# Patient Record
Sex: Male | Born: 1990 | Race: Black or African American | Hispanic: No | Marital: Single | Smoking: Never smoker
Health system: Southern US, Community
[De-identification: ages and names within clinical notes are randomized; demographics above are authoritative.]

## PROBLEM LIST (undated history)

## (undated) DIAGNOSIS — E669 Obesity, unspecified: Secondary | ICD-10-CM

## (undated) HISTORY — DX: Obesity, unspecified: E66.9

## (undated) HISTORY — PX: WISDOM TOOTH EXTRACTION: SHX21

---
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
## 2013-01-28 ENCOUNTER — Ambulatory Visit (INDEPENDENT_AMBULATORY_CARE_PROVIDER_SITE_OTHER): Payer: Medicaid Other | Admitting: Pediatrics

## 2013-01-28 ENCOUNTER — Encounter: Payer: Self-pay | Admitting: Pediatrics

## 2013-01-28 VITALS — BP 128/88 | Ht 68.78 in | Wt 296.7 lb

## 2013-01-28 DIAGNOSIS — Z68.41 Body mass index (BMI) pediatric, greater than or equal to 95th percentile for age: Secondary | ICD-10-CM

## 2013-01-28 DIAGNOSIS — Z0000 Encounter for general adult medical examination without abnormal findings: Secondary | ICD-10-CM

## 2013-01-28 DIAGNOSIS — Z113 Encounter for screening for infections with a predominantly sexual mode of transmission: Secondary | ICD-10-CM

## 2013-01-28 NOTE — Addendum Note (Signed)
Addended by: Fortino Sic C on: 01/28/2013 05:16 PM   Modules accepted: Level of Service

## 2013-01-28 NOTE — Progress Notes (Addendum)
Routine Well-Adolescent Visit    History was provided by the patient.  Randy Dawson is a 22 y.o. male who is here for complete physical exam. Generally he states that he has been in good health. He recently finished a 9 month program at [employer]. He has no current medical problems or other concerns.   Current concerns: None.    Past Medical History:  No Known Allergies Past Medical History  Diagnosis Date  . Obesity   . Obesity (BMI 30-39.9)     Family history:  Family History  Problem Relation Age of Onset  . Allergies Father   . Hypertension Sister   . Diabetes Maternal Grandmother   . Cancer Maternal Grandmother   . Hypertension Maternal Grandmother   . Diabetes Maternal Grandfather   . Diabetes Cousin   . Hyperlipidemia Other   No heart attacks. MGM, MGF  Adolescent Assessment:  Confidentiality was discussed with the patient and if applicable, with caregiver as well.  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 Got a certificate of completion for 9 months.  School History: is considering going back to school to be a Warden/ranger.  Work: Working at [employer]:  With parent out of the room and confidentiality discussed:   Drugs:  Smoking: no Secondhand smoke exposure? yes - Mom and Grandfather smoke; People in house mom, grandma, kids  Drugs/EtOH: no THC, occasionally drinks liquor a few shots at at time every 2 months  Sexuality:  - Sexually active? yes - with one male partner  - sexual partners in last year: has had 6 partners - contraception use: condoms - Last STI Screening: has a history  of Chlamydia; not been tested recently  - Violence/Abuse: feels safe in current situation  Suicide and Depression:  Mood/Suicidality: good Weapons: no guns   Review of Systems:  Constitutional:   Denies fever  Vision: Denies concerns about vision  HENT: Denies concerns about hearing, snoring  Lungs:   Denies difficulty breathing  Heart:   Denies chest pain  Gastrointestinal:   Denies abdominal pain, constipation, diarrhea  Genitourinary:   Denies dysuria  Neurologic:   Denies headaches      Physical Exam:    Filed Vitals:   01/28/13 1604  BP: 128/88  Height: 5' 8.78" (1.747 m)  Weight: 296 lb 11.8 oz (134.6 kg)   Facility age limit for growth percentiles is 20 years.  General Appearance:   obese AAM with multiple tattoos  HENT: Normocephalic, no obvious abnormality, PERRL, EOM's intact, conjunctiva clear  Mouth:   Normal appearing teeth, no obvious discoloration, dental caries, or dental caps  Neck:   Supple; thyroid: no enlargement, symmetric, no tenderness/mass/nodules  Lungs:   Clear to auscultation bilaterally, normal work of breathing  Heart:   Regular rate and rhythm, S1 and S2 normal, no murmurs;   Abdomen:   Soft, non-tender, no mass, or organomegaly  GU normal male genitals, no testicular masses or hernia  Musculoskeletal:   Tone and strength strong and symmetrical, all extremities               Lymphatic:   No cervical adenopathy  Skin/Hair/Nails:   Skin warm,  dry and intact, no rashes, no bruises or petechiae; Multiple tattoos   Neurologic:   Strength, gait, and coordination normal and age-appropriate    Assessment/Plan:  1. Routine general medical examination at a health care facility Patient to eventually transition to the care of Dr. Marina Goodell.   Risky Sexual Behaviors: He has been engaging in unprotected sex and has had multiple partners over the past year as well as a history of STI. Counseled on safe sex today. Will send urine GC/Chlamydia, serum HIV,  and RPR. Will also send Hep C, given tatoos.   Morbid Obesity:  The patient was counseled regarding nutrition and physical activity. His BMI is 44. We discussed the implications of this for his health, particularly given his family history of DM, HTN, HLD. Will screen with  cholesterol, DM, and TSH today.   - Follow-up visit in 3 months for next visit, or sooner as needed.       I saw and evaluated the patient, performing the key elements of the service. I developed the management plan that is described in the resident's note, and I agree with the content.  HARTSELL,ANGELA H                  01/28/2013, 5:12 PM

## 2013-01-28 NOTE — Patient Instructions (Signed)
Obesity Obesity is defined as having too much total body fat and a body mass index (BMI) of 30 or more. BMI is an estimate of body fat and is calculated from your height and weight. Obesity happens when you consume more calories than you can burn by exercising or performing daily physical tasks. Prolonged obesity can cause major illnesses or emergencies, such as:   A stroke.  Heart disease.  Diabetes.  Cancer.  Arthritis.  High blood pressure (hypertension).  High cholesterol.  Sleep apnea.  Erectile dysfunction.  Infertility problems. CAUSES   Regularly eating unhealthy foods.  Physical inactivity.  Certain disorders, such as an underactive thyroid (hypothyroidism), Cushing's syndrome, and polycystic ovarian syndrome.  Certain medicines, such as steroids, some depression medicines, and antipsychotics.  Genetics.  Lack of sleep. DIAGNOSIS  A caregiver can diagnose obesity after calculating your BMI. Obesity will be diagnosed if your BMI is 30 or higher.  There are other methods of measuring obesity levels. Some other methods include measuring your skin fold thickness, your waist circumference, and comparing your hip circumference to your waist circumference. TREATMENT  A healthy treatment program includes some or all of the following:  Long-term dietary changes.  Exercise and physical activity.  Behavioral and lifestyle changes.  Medicine only under the supervision of your caregiver. Medicines may help, but only if they are used with diet and exercise programs. An unhealthy treatment program includes:  Fasting.  Fad diets.  Supplements and drugs. These choices do not succeed in long-term weight control.  HOME CARE INSTRUCTIONS   Exercise and perform physical activity as directed by your caregiver. To increase physical activity, try the following:  Use stairs instead of elevators.  Park farther away from store entrances.  Garden, bike, or walk instead of  watching television or using the computer.  Eat healthy, low-calorie foods and drinks on a regular basis. Eat more fruits and vegetables. Use low-calorie cookbooks or take healthy cooking classes.  Limit fast food, sweets, and processed snack foods.  Eat smaller portions.  Keep a daily journal of everything you eat. There are many free websites to help you with this. It may be helpful to measure your foods so you can determine if you are eating the correct portion sizes.  Avoid drinking alcohol. Drink more water and drinks without calories.  Take vitamins and supplements only as recommended by your caregiver.  Weight-loss support groups, Registered Dieticians, counselors, and stress reduction education can also be very helpful. SEEK IMMEDIATE MEDICAL CARE IF:  You have chest pain or tightness.  You have trouble breathing or feel short of breath.  You have weakness or leg numbness.  You feel confused or have trouble talking.  You have sudden changes in your vision. MAKE SURE YOU:  Understand these instructions.  Will watch your condition.  Will get help right away if you are not doing well or get worse. Document Released: 05/19/2004 Document Revised: 10/11/2011 Document Reviewed: 05/18/2011 ExitCare Patient Information 2014 [employer].  

## 2013-02-26 LAB — LIPID PANEL
HDL: 50 mg/dL (ref >39)
LDL Cholesterol: 164 mg/dL — ABNORMAL HIGH (ref 0–99)
Total CHOL/HDL Ratio: 4.5 Ratio
Triglycerides: 60 mg/dL (ref <150)
VLDL: 12 mg/dL (ref 0–40)

## 2013-02-27 LAB — HEPATITIS C ANTIBODY: HCV Ab: NEGATIVE

## 2013-05-03 ENCOUNTER — Encounter: Payer: Self-pay | Admitting: Pediatrics

## 2013-05-03 ENCOUNTER — Ambulatory Visit (INDEPENDENT_AMBULATORY_CARE_PROVIDER_SITE_OTHER): Payer: Medicaid Other | Admitting: Pediatrics

## 2013-05-03 VITALS — BP 118/80 | Ht 68.78 in | Wt 305.8 lb

## 2013-05-03 DIAGNOSIS — Z23 Encounter for immunization: Secondary | ICD-10-CM

## 2013-05-03 DIAGNOSIS — E785 Hyperlipidemia, unspecified: Secondary | ICD-10-CM

## 2013-05-03 NOTE — Patient Instructions (Signed)
It was good seeing you today.  I am glad we had time to review your labs.  We discussed that your cholesterol is very high and it is time to make some changes to improve your health.  We discussed cutting back on your soda intake and eating less fast food.  Let's follow-up in 2-3 months to test your levels again and determine if other treatments are needed.

## 2013-05-03 NOTE — Progress Notes (Signed)
Adolescent Medicine Consultation Follow-Up Visit Randy Dawson  is a 23 y.o. male referred by Pediatric Teaching service here today for follow-up of lab results and to establish care.   PCP Confirmed?  yes  No primary provider on file.   History was provided by the patient.  Chart review:  This is his first visit with me, previously seen by the Pediatric teaching service. Treatment plan at last visit was routine healthcare maintenance and lab testing.   No LMP for male patient.  Last STI screen: Urine GC/CT neg 02/26/13 Other Labs:  Component     Latest Ref Rng 02/26/2013  Cholesterol     0 - 200 mg/dL 161226 (H)  Triglycerides     <150 mg/dL 60  HDL     >09>39 mg/dL 50  Total CHOL/HDL Ratio      4.5  VLDL     0 - 40 mg/dL 12  LDL (calc)     0 - 99 mg/dL 604164 (H)  Hemoglobin V4UA1C     <5.7 % 5.5  Mean Plasma Glucose     <117 mg/dL 981111  GC Probe Amp, Urine     NEGATIVE NEGATIVE  HIV     NON REACTIVE NON REACTIVE  HCV Ab     NEGATIVE NEGATIVE  RPR     NON REAC NON REAC  TSH     0.350 - 4.500 uIU/mL 1.573       Stays with Grandmother  HPI:  Pt reports he would like to review his lab results.  He is interested in improving his health but reports he does not really know how to do that with his schedule.    Reviewed patient's daily schedule. Works at [employer]: 6p-11p,  During the day, watches his daughter, turning 1 yr soon Doesn't eat during the day but eats junk food at night after finishing his work shift Taking naps when his daughter naps Drinks juice, soda, occasional water, tea  36 ounces soda per day - mountain dew 510 calories per day Wt gain per year 53 lbs per year  ROS  Problem List Reviewed:  yes Medication List Reviewed:   yes  Physical Exam:  Filed Vitals:   05/03/13 1104  BP: 118/80  Height: 5' 8.78" (1.747 m)  Weight: 305 lb 12.8 oz (138.71 kg)   BP 118/80  Ht 5' 8.78" (1.747 m)  Wt 305 lb 12.8 oz (138.71 kg)  BMI 45.45 kg/m2 Body mass  index: body mass index is 45.45 kg/(m^2). Facility age limit for growth percentiles is 20 years.  Physical Examination: General appearance - alert, well appearing, and in no distress  Assessment/Plan: 23 yo male with hyperlipidemia and obesity.  Discussed potential lifestyle changes.  Pt reluctant to make changes but agrees to try to "cut back."  Tried to help him make specific changes but he did not commit to any specific changes.  Agreed to f/u in 2-3 months.  Recheck fasting lipids in 1 year.  Medical decision-making:  - 20 minutes spent, more than 50% of appointment was spent discussing diagnosis and management of symptoms

## 2013-05-10 ENCOUNTER — Other Ambulatory Visit (HOSPITAL_COMMUNITY)
Admission: RE | Admit: 2013-05-10 | Discharge: 2013-05-10 | Disposition: A | Discharge status: Home / Self Care | Payer: Medicaid Other | Source: Ambulatory Visit | Attending: Pediatrics | Admitting: Pediatrics

## 2013-05-10 ENCOUNTER — Encounter: Payer: Self-pay | Admitting: Pediatrics

## 2013-05-10 ENCOUNTER — Ambulatory Visit (INDEPENDENT_AMBULATORY_CARE_PROVIDER_SITE_OTHER): Payer: Medicaid Other | Admitting: Pediatrics

## 2013-05-10 VITALS — BP 128/82 | Temp 98.4°F | Ht 69.0 in | Wt 304.8 lb

## 2013-05-10 DIAGNOSIS — S30812A Abrasion of penis, initial encounter: Secondary | ICD-10-CM

## 2013-05-10 DIAGNOSIS — IMO0002 Reserved for concepts with insufficient information to code with codable children: Secondary | ICD-10-CM

## 2013-05-10 DIAGNOSIS — Z113 Encounter for screening for infections with a predominantly sexual mode of transmission: Secondary | ICD-10-CM | POA: Insufficient documentation

## 2013-05-10 MED ORDER — CEPHALEXIN 500 MG PO CAPS: 500.0000 mg | ORAL_CAPSULE | Freq: Two times a day (BID) | ORAL | Status: AC

## 2013-05-10 NOTE — Patient Instructions (Signed)
Abrasion °An abrasion is a cut or scrape of the skin. Abrasions do not extend through all layers of the skin and most heal within 10 days. It is important to care for your abrasion properly to prevent infection. °CAUSES  °Most abrasions are caused by falling on, or gliding across, the ground or other surface. When your skin rubs on something, the outer and inner layer of skin rubs off, causing an abrasion. °DIAGNOSIS  °Your caregiver will be able to diagnose an abrasion during a physical exam.  °TREATMENT  °Your treatment depends on how large and deep the abrasion is. Generally, your abrasion will be cleaned with water and a mild soap to remove any dirt or debris. An antibiotic ointment may be put over the abrasion to prevent an infection. A bandage (dressing) may be wrapped around the abrasion to keep it from getting dirty.  °You may need a tetanus shot if: °· You cannot remember when you had your last tetanus shot. °· You have never had a tetanus shot. °· The injury broke your skin. °If you get a tetanus shot, your arm may swell, get red, and feel warm to the touch. This is common and not a problem. If you need a tetanus shot and you choose not to have one, there is a rare chance of getting tetanus. Sickness from tetanus can be serious.  °HOME CARE INSTRUCTIONS  °· If a dressing was applied, change it at least once a day or as directed by your caregiver. If the bandage sticks, soak it off with warm water.   °· Wash the area with water and a mild soap to remove all the ointment 2 times a day. Rinse off the soap and pat the area dry with a clean towel.   °· Reapply any ointment as directed by your caregiver. This will help prevent infection and keep the bandage from sticking. Use gauze over the wound and under the dressing to help keep the bandage from sticking.   °· Change your dressing right away if it becomes wet or dirty.   °· Only take over-the-counter or prescription medicines for pain, discomfort, or fever as  directed by your caregiver.   °· Follow up with your caregiver within 24 48 hours for a wound check, or as directed. If you were not given a wound-check appointment, look closely at your abrasion for redness, swelling, or pus. These are signs of infection. °SEEK IMMEDIATE MEDICAL CARE IF:  °· You have increasing pain in the wound.   °· You have redness, swelling, or tenderness around the wound.   °· You have pus coming from the wound.   °· You have a fever or persistent symptoms for more than 2 3 days. °· You have a fever and your symptoms suddenly get worse. °· You have a bad smell coming from the wound or dressing.   °MAKE SURE YOU:  °· Understand these instructions. °· Will watch your condition. °· Will get help right away if you are not doing well or get worse. °Document Released: 01/19/2005 Document Revised: 03/28/2012 Document Reviewed: 03/15/2011 °ExitCare® Patient Information ©2014 [employer]. ° °

## 2013-05-10 NOTE — Progress Notes (Signed)
Subjective:     Patient ID: Randy Dawson, male   DOB: 05/09/1990, 23 y.o.   MRN: [id number]  HPI Randy Dawson is here today with concern of lesions on his penis he describes as "cuts" for the past 2 days.  He states this occurred from "rough sex" but clarifies for this physician that no intentional injuries or enhancements were involved. He states no use of a condom. No fever, inguinal swelling or other concerns.  No history of boils.  Review of Systems  Constitutional: Negative for fever.  Genitourinary: Positive for genital sores and penile pain. Negative for dysuria and testicular pain.       Objective:   Physical Exam  Constitutional:  Obese adolescent in no obvious distress  Genitourinary:  Penis is circumcised with no urethral discharge; glans penis is not inflamed; there are several areas of abrasion and skin breakdown at the distal shaft adjacent to the corona in location consistent with foreskin scar; skin surrounding the macerated appearing areas is erythematous; no bleeding or purulence       Assessment:     Penile abrasion    Plan:     Meds ordered this encounter  Medications  . cephALEXin (KEFLEX) 500 MG capsule    Sig: Take 1 capsule (500 mg total) by mouth 2 (two) times daily.    Dispense:  20 capsule    Refill:  0   Soap and water hygiene and ok to apply A & D ointment to involved areas to decrease friction from clothing.  Advised no sexual intercourse until area heals; advised condom use thereafter for general protection from infection and friction.  Urine sent for GC and chlamydia screening due to lack of barrier use.

## 2013-05-14 ENCOUNTER — Telehealth: Payer: Self-pay | Admitting: Pediatrics

## 2013-05-14 DIAGNOSIS — A5601 Chlamydial cystitis and urethritis: Secondary | ICD-10-CM

## 2013-05-14 DIAGNOSIS — E785 Hyperlipidemia, unspecified: Secondary | ICD-10-CM | POA: Insufficient documentation

## 2013-05-14 MED ORDER — AZITHROMYCIN 250 MG PO TABS: ORAL_TABLET | ORAL | Status: DC

## 2013-05-14 NOTE — Telephone Encounter (Signed)
Attempted to reach Randy Dawson at number he provided [phone]); left message to call office.  Test returned positive for chlamydia.  [facility] and discuss when patient is contacted.

## 2013-05-15 NOTE — Telephone Encounter (Signed)
Patient called back and I explained to him his test result and the importance of getting the medication and taking all of it.  Also advised him to let his partner(s) know so they may be treated as well.  He verbalized understanding.

## 2013-05-15 NOTE — Telephone Encounter (Signed)
Called and left a vm for patient to call us for lab results on his cell number.  Called home number and asked mom to have patient call us.  She verbalized understanding.

## 2013-06-06 ENCOUNTER — Encounter (HOSPITAL_COMMUNITY): Payer: Self-pay | Admitting: Emergency Medicine

## 2013-06-06 ENCOUNTER — Emergency Department (HOSPITAL_COMMUNITY)
Admission: EM | Admit: 2013-06-06 | Discharge: 2013-06-06 | Disposition: A | Discharge status: Home / Self Care | Payer: Medicaid Other | Attending: Emergency Medicine | Admitting: Emergency Medicine

## 2013-06-06 DIAGNOSIS — Z113 Encounter for screening for infections with a predominantly sexual mode of transmission: Secondary | ICD-10-CM

## 2013-06-06 DIAGNOSIS — E669 Obesity, unspecified: Secondary | ICD-10-CM | POA: Insufficient documentation

## 2013-06-06 LAB — GC/CHLAMYDIA PROBE AMP
CT PROBE, AMP APTIMA: NEGATIVE
GC PROBE AMP APTIMA: NEGATIVE

## 2013-06-06 LAB — HIV ANTIBODY (ROUTINE TESTING W REFLEX): HIV: NONREACTIVE

## 2013-06-06 LAB — RPR: RPR: NONREACTIVE

## 2013-06-06 MED ORDER — AZITHROMYCIN 250 MG PO TABS
1000.0000 mg | ORAL_TABLET | Freq: Once | ORAL | Status: AC
  Administered 2013-06-06: 1000 mg via ORAL
  Filled 2013-06-06: qty 4

## 2013-06-06 MED ORDER — CEFTRIAXONE SODIUM 250 MG IJ SOLR
250.0000 mg | Freq: Once | INTRAMUSCULAR | Status: AC
  Administered 2013-06-06: 250 mg via INTRAMUSCULAR
  Filled 2013-06-06: qty 250

## 2013-06-06 MED ORDER — LIDOCAINE HCL 1 % IJ SOLN
INTRAMUSCULAR | Status: AC
  Administered 2013-06-06: 0.9 mL
  Filled 2013-06-06: qty 20

## 2013-06-06 NOTE — ED Provider Notes (Signed)
CSN: [id number]     Arrival date & time 06/06/13  0050 History   First MD Initiated Contact with Patient 06/06/13 0106     Chief Complaint  Patient presents with  . Exposure to STD     (Consider location/radiation/quality/duration/timing/severity/associated sxs/prior Treatment) HPI History per patient. Here for STD check. Was diagnosed with chlamydia recently, continue to have intercourse with his goal friend who was never treated or evaluated. Patient believes he has become reinfected. He denies any GU rash, pain or swelling. No penile drip. No fevers or chills. No abdominal pain. No back pain. No testicle pain. No hematuria or difficulty urinating  Past Medical History  Diagnosis Date  . Obesity   . Obesity (BMI 30-39.9)    Past Surgical History  Procedure Laterality Date  . Wisdom tooth extraction     Family History  Problem Relation Age of Onset  . Allergies Father   . Hypertension Sister   . Diabetes Maternal Grandmother   . Cancer Maternal Grandmother   . Hypertension Maternal Grandmother   . Diabetes Maternal Grandfather   . Diabetes Cousin   . Hyperlipidemia Other    History  Substance Use Topics  . Smoking status: Passive Smoke Exposure - Never Smoker  . Smokeless tobacco: Never Used  . Alcohol Use: No    Review of Systems  All other systems reviewed and are negative.      Allergies  Review of patient's allergies indicates no known allergies.  Home Medications  No current outpatient prescriptions on file. BP 155/91  Pulse 79  Temp(Src) 98.6 F (37 C) (Oral)  Resp 18  Ht 5\' 9"  (1.753 m)  Wt 305 lb (138.347 kg)  BMI 45.02 kg/m2  SpO2 97% Physical Exam  Constitutional: He is oriented to person, place, and time. He appears well-developed and well-nourished.  HENT:  Head: Normocephalic and atraumatic.  Eyes: EOM are normal. Pupils are equal, round, and reactive to light.  Neck: Neck supple.  Cardiovascular: Regular rhythm and intact distal  pulses.   Pulmonary/Chest: Effort normal. No respiratory distress.  Genitourinary:  Uncircumcised, no GU rash or lesion. No testicle tenderness. No penile discharge  Musculoskeletal: Normal range of motion. He exhibits no edema.  Neurological: He is alert and oriented to person, place, and time.  Skin: Skin is warm and dry.    ED Course  Procedures (including critical care time) Labs Review Labs Reviewed  GC/CHLAMYDIA PROBE AMP  RPR  HIV ANTIBODY (ROUTINE TESTING)   GU probe - results pending. Patient agrees to followup with health department.  STD ABx provided  Safe sex precautions and instructions given. Patient states understanding and agrees to notify all sexual contacts, and followup with his results MDM   Dx: STD check  Labs sent/ pending Medications provided VS and nurses notes reviewed   Sunnie NielsenBrian Mikie Misner, MD 06/06/13 [phone]

## 2013-06-06 NOTE — Discharge Instructions (Signed)
Sexually Transmitted Disease A sexually transmitted disease (STD) is a disease or infection. It may be passed from person to person. It usually is passed during sex. STDs can be spread by different types of germs. These germs are bacteria, viruses, and parasites. An STD can be passed through:  Spit (saliva).  Semen.  Blood.  Mucus from the vagina.  Pee (urine). HOW CAN I LESSEN MY CHANCES OF GETTING AN STD?  Only use condoms labeled "latex," dental dams, and lubricants that wash away with water (water soluble). Do not use petroleum jelly or oils.  Get shots (vaccines) for HPV and hepatitis.  Avoid risky sex behavior that can break the skin. WHAT SHOULD I DO IF I THINK I HAVE AN STD?  See your doctor.  Tell your sex partner(s) that you have an STD. They should be tested and treated.  Do not have sex until your doctor says it is OK. WHEN SHOULD I GET HELP? Get help if:  You have bad belly (abdominal) pain.  You are a man and have puffiness (swelling) or pain in your testicles.  You are a woman and have puffiness in your vagina. MAKE SURE YOU:  Understand these instructions. Document Released: 05/19/2004 Document Revised: 01/30/2013 Document Reviewed: 10/05/2012 [employer] Patient Information 2014 [employer].  

## 2013-06-06 NOTE — ED Notes (Signed)
Pt denies an GU symptoms.

## 2013-06-06 NOTE — ED Notes (Signed)
Pt requesting to be checked for STD. Pt states he was tx recently, told his girlfriend to be checked, unsure if she did now would like to be rechecked to see if she gave it back to him. Pt denies s/s

## 2013-06-28 ENCOUNTER — Emergency Department (HOSPITAL_COMMUNITY)
Admission: EM | Admit: 2013-06-28 | Discharge: 2013-06-28 | Disposition: A | Discharge status: Home / Self Care | Payer: Medicaid Other | Attending: Emergency Medicine | Admitting: Emergency Medicine

## 2013-06-28 ENCOUNTER — Encounter (HOSPITAL_COMMUNITY): Payer: Self-pay | Admitting: Emergency Medicine

## 2013-06-28 DIAGNOSIS — Z113 Encounter for screening for infections with a predominantly sexual mode of transmission: Secondary | ICD-10-CM | POA: Insufficient documentation

## 2013-06-28 LAB — GC/CHLAMYDIA PROBE AMP
CT Probe RNA: NEGATIVE
GC PROBE AMP APTIMA: NEGATIVE

## 2013-06-28 LAB — HIV ANTIBODY (ROUTINE TESTING W REFLEX): HIV: NONREACTIVE

## 2013-06-28 LAB — RPR: RPR Ser Ql: NONREACTIVE

## 2013-06-28 MED ORDER — CEFTRIAXONE SODIUM 250 MG IJ SOLR
250.0000 mg | Freq: Once | INTRAMUSCULAR | Status: AC
  Administered 2013-06-28: 250 mg via INTRAMUSCULAR
  Filled 2013-06-28: qty 250

## 2013-06-28 MED ORDER — AZITHROMYCIN 250 MG PO TABS
1000.0000 mg | ORAL_TABLET | Freq: Once | ORAL | Status: AC
  Administered 2013-06-28: 1000 mg via ORAL
  Filled 2013-06-28: qty 4

## 2013-06-28 NOTE — ED Provider Notes (Signed)
Medical screening examination/treatment/procedure(s) were performed by non-physician practitioner and as supervising physician I was immediately available for consultation/collaboration.    Randy Dawson M Aaliah Jorgenson, MD 06/28/13 [phone]

## 2013-06-28 NOTE — ED Provider Notes (Signed)
CSN: [id number]     Arrival date & time 06/28/13  0022 History   First MD Initiated Contact with Patient 06/28/13 0155     Chief Complaint  Patient presents with  . SEXUALLY TRANSMITTED DISEASE     (Consider location/radiation/quality/duration/timing/severity/associated sxs/prior Treatment) The history is provided by the patient and medical records. No language interpreter was used.    Belford Leeroy BockW Vacha is a 23 y.o. male  with no major medical history presents to the Emergency Department expressing concern about having contracted an STD.  Pt reports one night stand with a new male sexual partner 2 weeks ago.  He denies dysuria, hematuria, fever, chills, nausea vomiting, diarrhea, abdominal pain, testicular pain, penile pain, penile discharge.  He reports distant history of STD which was treated.  He reports he just wants to be tested again. He has not attempted any treatment.  Past Medical History  Diagnosis Date  . Obesity   . Obesity (BMI 30-39.9)    Past Surgical History  Procedure Laterality Date  . Wisdom tooth extraction     Family History  Problem Relation Age of Onset  . Allergies Father   . Hypertension Sister   . Diabetes Maternal Grandmother   . Cancer Maternal Grandmother   . Hypertension Maternal Grandmother   . Diabetes Maternal Grandfather   . Diabetes Cousin   . Hyperlipidemia Other    History  Substance Use Topics  . Smoking status: Passive Smoke Exposure - Never Smoker  . Smokeless tobacco: Never Used  . Alcohol Use: No    Review of Systems  Constitutional: Negative for fever, diaphoresis, appetite change, fatigue and unexpected weight change.  HENT: Negative for mouth sores.   Eyes: Negative for visual disturbance.  Respiratory: Negative for cough, chest tightness, shortness of breath and wheezing.   Cardiovascular: Negative for chest pain.  Gastrointestinal: Negative for nausea, vomiting, abdominal pain, diarrhea and constipation.  Endocrine:  Negative for polydipsia, polyphagia and polyuria.  Genitourinary: Negative for dysuria, urgency, frequency and hematuria.  Musculoskeletal: Negative for back pain and neck stiffness.  Skin: Negative for rash.  Allergic/Immunologic: Negative for immunocompromised state.  Neurological: Negative for syncope, light-headedness and headaches.  Hematological: Does not bruise/bleed easily.  Psychiatric/Behavioral: Negative for sleep disturbance. The patient is not nervous/anxious.       Allergies  Review of patient's allergies indicates no known allergies.  Home Medications  No current outpatient prescriptions on file. BP 148/73  Pulse 66  Temp(Src) 99 F (37.2 C) (Oral)  Resp 16  Ht 5\' 9"  (1.753 m)  Wt 294 lb (133.358 kg)  BMI 43.40 kg/m2  SpO2 97% Physical Exam  Nursing note and vitals reviewed. Constitutional: He appears well-developed and well-nourished. No distress.  Awake, alert, nontoxic appearance  HENT:  Head: Normocephalic and atraumatic.  Mouth/Throat: Oropharynx is clear and moist. No oropharyngeal exudate.  Eyes: Conjunctivae are normal. No scleral icterus.  Neck: Normal range of motion. Neck supple.  Cardiovascular: Normal rate, regular rhythm, normal heart sounds and intact distal pulses.   No murmur heard. Regular rate and rhythm  Pulmonary/Chest: Effort normal and breath sounds normal. No respiratory distress. He has no wheezes.  Clear and equal breath sounds  Abdominal: Soft. Bowel sounds are normal. He exhibits no mass. There is no tenderness. There is no rebound and no guarding. Hernia confirmed negative in the right inguinal area and confirmed negative in the left inguinal area.  Abdomen soft and nontender  Genitourinary: Testes normal and penis normal. Cremasteric reflex is  present. Right testis shows no mass, no swelling and no tenderness. Right testis is descended. Cremasteric reflex is not absent on the right side. Left testis shows no mass, no swelling and  no tenderness. Left testis is descended. Cremasteric reflex is not absent on the left side. No phimosis, paraphimosis, hypospadias, penile erythema or penile tenderness. No discharge found.  No penile discharge No Lymphadenopathy  Musculoskeletal: Normal range of motion. He exhibits no edema.  Lymphadenopathy:       Right: No inguinal adenopathy present.       Left: No inguinal adenopathy present.  Neurological: He is alert.  Speech is clear and goal oriented Moves extremities without ataxia  Skin: Skin is warm and dry. He is not diaphoretic.  Psychiatric: He has a normal mood and affect.    ED Course  Procedures (including critical care time) Labs Review Labs Reviewed  GC/CHLAMYDIA PROBE AMP  RPR  HIV ANTIBODY (ROUTINE TESTING)   Imaging Review No results found.   EKG Interpretation None      MDM   Final diagnoses:  Screen for STD (sexually transmitted disease)   Kosisochukwu Leeroy Bock presents with concerns about having contracted an STD. Will obtain STD panel and treat prophylactically.  STD cultures obtained including gonorrhea, chlamydia, HIV and syphilis. Patient to be discharged with instructions to follow up with PCP. Discussed importance of using protection when sexually active. Pt understands that they have GC/Chlamydia cultures pending and that they will need to inform all sexual partners if results return positive. I have also discussed reasons to return immediately to the ER. Patient expresses understanding and agrees with plan.  It has been determined that no acute conditions requiring further emergency intervention are present at this time. The patient/guardian have been advised of the diagnosis and plan. We have discussed signs and symptoms that warrant return to the ED, such as changes or worsening in symptoms.   Vital signs are stable at discharge.   BP 148/73  Pulse 66  Temp(Src) 99 F (37.2 C) (Oral)  Resp 16  Ht 5\' 9"  (1.753 m)  Wt 294 lb (133.358 kg)   BMI 43.40 kg/m2  SpO2 97%  Patient/guardian has voiced understanding and agreed to follow-up with the PCP or specialist.      Dierdre Forth, PA-C 06/28/13 0255

## 2013-06-28 NOTE — Discharge Instructions (Signed)
1. Medications: usual home medications 2. Treatment: rest, drink plenty of fluids,  3. Follow Up: Please followup with your primary doctor for discussion of your diagnoses and further evaluation after today's visit; if you do not have a primary care doctor use the resource guide provided to find one;     Sexually Transmitted Disease A sexually transmitted disease (STD) is a disease or infection that may be passed (transmitted) from person to person, usually during sexual activity. This may happen by way of saliva, semen, blood, vaginal mucus, or urine. Common STDs include:   Gonorrhea.   Chlamydia.   Syphilis.   HIV and AIDS.   Genital herpes.   Hepatitis B and C.   Trichomonas.   Human papillomavirus (HPV).   Pubic lice.   Scabies.  Mites.  Bacterial vaginosis. WHAT ARE CAUSES OF STDs? An STD may be caused by bacteria, a virus, or parasites. STDs are often transmitted during sexual activity if one person is infected. However, they may also be transmitted through nonsexual means. STDs may be transmitted after:   Sexual intercourse with an infected person.   Sharing sex toys with an infected person.   Sharing needles with an infected person or using unclean piercing or tattoo needles.  Having intimate contact with the genitals, mouth, or rectal areas of an infected person.   Exposure to infected fluids during birth. WHAT ARE THE SIGNS AND SYMPTOMS OF STDs? Different STDs have different symptoms. Some people may not have any symptoms. If symptoms are present, they may include:   Painful or bloody urination.   Pain in the pelvis, abdomen, vagina, anus, throat, or eyes.   Skin rash, itching, irritation, growths, sores (lesions), ulcerations, or warts in the genital or anal area.  Abnormal vaginal discharge with or without bad odor.   Penile discharge in men.   Fever.   Pain or bleeding during sexual intercourse.   Swollen glands in the groin  area.   Yellow skin and eyes (jaundice). This is seen with hepatitis.   Swollen testicles.  Infertility.  Sores and blisters in the mouth. HOW ARE STDs DIAGNOSED? To make a diagnosis, your health care provider may:   Take a medical history.   Perform a physical exam.   Take a sample of any discharge for examination.  Swab the throat, cervix, opening to the penis, rectum, or vagina for testing.  Test a sample of your first morning urine.   Perform blood tests.   Perform a Pap smear, if this applies.   Perform a colposcopy.   Perform a laparoscopy.  HOW ARE STDs TREATED? Treatment depends on the STD. Some STDs may be treated but not cured.   Chlamydia, gonorrhea, trichomonas, and syphilis can be cured with antibiotics.   Genital herpes, hepatitis, and HIV can be treated, but not cured, with prescribed medicines. The medicines lessen symptoms.   Genital warts from HPV can be treated with medicine or by freezing, burning (electrocautery), or surgery. Warts may come back.   HPV cannot be cured with medicine or surgery. However, abnormal areas may be removed from the cervix, vagina, or vulva.   If your diagnosis is confirmed, your recent sexual partners need treatment. This is true even if they are symptom-free or have a negative culture or evaluation. They should not have sex until their health care providers say it is OK. HOW CAN I REDUCE MY RISK OF GETTING AN STD?  Use latex condoms, dental dams, and water-soluble lubricants during sexual activity.  Do not use petroleum jelly or oils.  Get vaccinated for HPV and hepatitis. If you have not received these vaccines in the past, talk to your health care provider about whether one or both might be right for you.   Avoid risky sex practices that can break the skin.  WHAT SHOULD I DO IF I THINK I HAVE AN STD?  See your health care provider.   Inform all sexual partners. They should be tested and treated for  any STDs.  Do not have sex until your health care provider says it is OK. WHEN SHOULD I GET HELP? Seek immediate medical care if:  You develop severe abdominal pain.  You are a man and notice swelling or pain in the testicles.  You are a woman and notice swelling or pain in your vagina. Document Released: 07/02/2002 Document Revised: 01/30/2013 Document Reviewed: 10/30/2012 [facility] Patient Information 2014 [place], [place].

## 2013-06-28 NOTE — ED Notes (Signed)
Pt states he's here for an STD check, states thinks he was exposed about 2 weeks ago, denies any symptoms at this time.

## 2013-07-16 ENCOUNTER — Ambulatory Visit: Payer: Medicaid Other

## 2013-07-16 ENCOUNTER — Emergency Department (HOSPITAL_COMMUNITY)
Admission: EM | Admit: 2013-07-16 | Discharge: 2013-07-16 | Disposition: A | Discharge status: Home / Self Care | Payer: Medicaid Other | Attending: Emergency Medicine | Admitting: Emergency Medicine

## 2013-07-16 ENCOUNTER — Encounter (HOSPITAL_COMMUNITY): Payer: Self-pay | Admitting: Emergency Medicine

## 2013-07-16 DIAGNOSIS — G629 Polyneuropathy, unspecified: Secondary | ICD-10-CM

## 2013-07-16 DIAGNOSIS — G5690 Unspecified mononeuropathy of unspecified upper limb: Secondary | ICD-10-CM | POA: Insufficient documentation

## 2013-07-16 DIAGNOSIS — E669 Obesity, unspecified: Secondary | ICD-10-CM | POA: Insufficient documentation

## 2013-07-16 MED ORDER — IBUPROFEN 800 MG PO TABS: 800.0000 mg | ORAL_TABLET | Freq: Three times a day (TID) | ORAL | Status: DC

## 2013-07-16 NOTE — Discharge Instructions (Signed)
Take ibuprofen with food, up to 800mg  three times a day, as needed for pain.   You can apply an ice pack to your wrist 2-3 times a day for 15-20 minutes as well.  Follow up with one of the neurology groups you have been referred to.   You may return to the ER if symptoms worsen or you have any other concerns.    Peripheral Neuropathy Peripheral neuropathy is a type of nerve damage. It affects nerves that carry signals between the spinal cord and other parts of the body. These are called peripheral nerves. With peripheral neuropathy, one nerve or a group of nerves may be damaged.  CAUSES  Many things can damage peripheral nerves. For some people with peripheral neuropathy, the cause is unknown. Some causes include:  Diabetes. This is the most common cause of peripheral neuropathy.  Injury to a nerve.  Pressure or stress on a nerve that lasts a long time.  Too little vitamin B. Alcoholism can lead to this.  Infections.  Autoimmune diseases, such as multiple sclerosis and systemic lupus erythematosus.  Inherited nerve diseases.  Some medicines, such as cancer drugs.  Toxic substances, such as lead and mercury.  Too little blood flowing to the legs.  Kidney disease.  Thyroid disease. SIGNS AND SYMPTOMS  Different people have different symptoms. The symptoms you have will depend on which of your nerves is damaged. Common symptoms include:  Loss of feeling (numbness) in the feet and hands.  Tingling in the feet and hands.  Pain that burns.  Very sensitive skin.  Weakness.  Not being able to move a part of the body (paralysis).  Muscle twitching.  Clumsiness or poor coordination.  Loss of balance.  Not being able to control your bladder.  Feeling dizzy.  Sexual problems. DIAGNOSIS  Peripheral neuropathy is a symptom, not a disease. Finding the cause of peripheral neuropathy can be hard. To figure that out, your health care provider will take a medical history and do  a physical exam. A neurological exam will also be done. This involves checking things affected by your brain, spinal cord, and nerves (nervous system). For example, your health care provider will check your reflexes, how you move, and what you can feel.  Other types of tests may also be ordered, such as:  Blood tests.  A test of the fluid in your spinal cord.  Imaging tests, such as CT scans or an MRI.  Electromyography (EMG). This test checks the nerves that control muscles.  Nerve conduction velocity tests. These tests check how fast messages pass through your nerves.  Nerve biopsy. A small piece of nerve is removed. It is then checked under a microscope. TREATMENT   Medicine is often used to treat peripheral neuropathy. Medicines may include:  Pain-relieving medicines. Prescription or over-the-counter medicine may be suggested.  Antiseizure medicine. This may be used for pain.  Antidepressants. These also may help ease pain from neuropathy.  Lidocaine. This is a numbing medicine. You might wear a patch or be given a shot.  Mexiletine. This medicine is typically used to help control irregular heart rhythms.  Surgery. Surgery may be needed to relieve pressure on a nerve or to destroy a nerve that is causing pain.  Physical therapy to help movement.  Assistive devices to help movement. HOME CARE INSTRUCTIONS   Only take over-the-counter or prescription medicines as directed by your health care provider. Follow the instructions carefully for any given medicines. Do not take any other  medicines without first getting approval from your health care provider.  If you have diabetes, work closely with your health care provider to keep your blood sugar under control.  If you have numbness in your feet:  Check every day for signs of injury or infection. Watch for redness, warmth, and swelling.  Wear padded socks and comfortable shoes. These help protect your feet.  Do not do  things that put pressure on your damaged nerve.  Do not smoke. Smoking keeps blood from getting to damaged nerves.  Avoid or limit alcohol. Too much alcohol can cause a lack of B vitamins. These vitamins are needed for healthy nerves.  Develop a good support system. Coping with peripheral neuropathy can be stressful. Talk to a mental health specialist or join a support group if you are struggling.  Follow up with your health care provider as directed. SEEK MEDICAL CARE IF:   You have new signs or symptoms of peripheral neuropathy.  You are struggling emotionally from dealing with peripheral neuropathy.  You have a fever. SEEK IMMEDIATE MEDICAL CARE IF:   You have an injury or infection that is not healing.  You feel very dizzy or begin vomiting.  You have chest pain.  You have trouble breathing. Document Released: 04/01/2002 Document Revised: 12/22/2010 Document Reviewed: 12/17/2012 [facility] Patient Information 2014 [place], [place].

## 2013-07-16 NOTE — ED Provider Notes (Signed)
CSN: [id number]     Arrival date & time 07/16/13  2041 History  This chart was scribed for non-physician practitioner, Kyung BaccaKatie Grettell Ransdell, PA-C working with Gerhard Munchobert Lockwood, MD by Greggory StallionKayla Andersen, ED scribe. This patient was seen in room WTR9/WTR9 and the patient's care was started at 10:25 PM.   Chief Complaint  Patient presents with  . Numbness   The history is provided by the patient. No language interpreter was used.   HPI Comments: Randy Dawson is a 23 y.o. male who presents to the Emergency Department complaining of constant numbness to his right ring and pinky fingers and to the left palmar aspect of his hand that started 2 weeks ago. Pt states it started one day after having blood drawn for STD testing. He states when he tried to pick something up, he dropped the object. Denies any injuries to his arm but states he plays video games. Denies fever, arm pain, neck pain, skin changes, weakness.   Past Medical History  Diagnosis Date  . Obesity   . Obesity (BMI 30-39.9)    Past Surgical History  Procedure Laterality Date  . Wisdom tooth extraction     Family History  Problem Relation Age of Onset  . Allergies Father   . Hypertension Sister   . Diabetes Maternal Grandmother   . Cancer Maternal Grandmother   . Hypertension Maternal Grandmother   . Diabetes Maternal Grandfather   . Diabetes Cousin   . Hyperlipidemia Other    History  Substance Use Topics  . Smoking status: Passive Smoke Exposure - Never Smoker  . Smokeless tobacco: Never Used  . Alcohol Use: No    Review of Systems  Constitutional: Negative for fever.  Musculoskeletal: Negative for myalgias and neck pain.  Skin: Negative for color change.  Neurological: Positive for numbness. Negative for weakness.  All other systems reviewed and are negative.   Allergies  Review of patient's allergies indicates no known allergies.  Home Medications  No current outpatient prescriptions on file.  There were no  vitals taken for this visit.  Physical Exam  Nursing note and vitals reviewed. Constitutional: He is oriented to person, place, and time. He appears well-developed and well-nourished. No distress.  HENT:  Head: Normocephalic and atraumatic.  Eyes:  Normal appearance  Neck: Normal range of motion.  Pulmonary/Chest: Effort normal.  Musculoskeletal: Normal range of motion.  Phlebotomy site R antecubital fossa is not visible.  No skin changes, edema or tenderness anywhere RUE.  No pain w/ passive ROM of elbow, wrist or fingers.  No sensory deficit of fingers.  Negative tinel and phalen sign.  Cervical spine non-tender.   Neurological: He is alert and oriented to person, place, and time.  Psychiatric: He has a normal mood and affect. His behavior is normal.    ED Course  Procedures (including critical care time)  COORDINATION OF CARE: 10:30 PM-Discussed treatment plan with pt at bedside and pt agreed to plan.   Labs Review Labs Reviewed - No data to display Imaging Review No results found.   EKG Interpretation None      MDM   Final diagnoses:  Peripheral neuropathy   Healthy [name]22yo M had blood drawn from R antecubital on 06/28/13.  Developed paresthesias in distrubution of ulnar nerve the following day and sx have been constant and stable since.  No focal neuro deficits and no sign of infectious process on exam.  Discussed case w/ Dr. Jeraldine LootsLockwood who recommends symptomatic treatment for peripheral neuropathy w/  NSAID and then f/u w/ neuro.  Plan relayed to patient w/ is agreable.  Prescribed 800mg  ibuprofen and recommended avoidance of video games until sx resolve (possible etiology).  Return precautions discussed.   I personally performed the services described in this documentation, which was scribed in my presence. The recorded information has been reviewed and is accurate.   Otilio Miu, PA-C 07/17/13 [phone]

## 2013-07-16 NOTE — ED Notes (Signed)
Pt states he was here two weeks ago and had blood draw for HIV test and ever since that time his right hand has been numb,  Pt able to grip but feels like he will drop whatever he is holding

## 2013-07-17 NOTE — ED Provider Notes (Signed)
  Medical screening examination/treatment/procedure(s) were performed by non-physician practitioner and as supervising physician I was immediately available for consultation/collaboration.   EKG Interpretation None         Jayland Null, MD 07/17/13 1607 

## 2013-08-01 ENCOUNTER — Ambulatory Visit: Payer: Medicaid Other | Admitting: Pediatrics

## 2013-08-05 ENCOUNTER — Telehealth: Payer: Self-pay

## 2013-08-05 NOTE — Telephone Encounter (Signed)
Called and left patient a VM that he missed his 08/01/13 appointment and to please call to reschedule.

## 2014-04-13 ENCOUNTER — Emergency Department (HOSPITAL_COMMUNITY)
Admission: EM | Admit: 2014-04-13 | Discharge: 2014-04-13 | Disposition: A | Discharge status: Home / Self Care | Payer: Medicaid Other | Attending: Emergency Medicine | Admitting: Emergency Medicine

## 2014-04-13 ENCOUNTER — Encounter (HOSPITAL_COMMUNITY): Payer: Self-pay | Admitting: *Deleted

## 2014-04-13 DIAGNOSIS — E669 Obesity, unspecified: Secondary | ICD-10-CM | POA: Insufficient documentation

## 2014-04-13 DIAGNOSIS — J019 Acute sinusitis, unspecified: Secondary | ICD-10-CM | POA: Insufficient documentation

## 2014-04-13 DIAGNOSIS — H9203 Otalgia, bilateral: Secondary | ICD-10-CM

## 2014-04-13 DIAGNOSIS — Z791 Long term (current) use of non-steroidal anti-inflammatories (NSAID): Secondary | ICD-10-CM | POA: Insufficient documentation

## 2014-04-13 DIAGNOSIS — H6593 Unspecified nonsuppurative otitis media, bilateral: Secondary | ICD-10-CM | POA: Insufficient documentation

## 2014-04-13 MED ORDER — AMOXICILLIN-POT CLAVULANATE 875-125 MG PO TABS
1.0000 | ORAL_TABLET | Freq: Two times a day (BID) | ORAL | Status: DC
Start: 2014-04-13 — End: 2015-08-14

## 2014-04-13 MED ORDER — SALINE SPRAY 0.65 % NA SOLN: 1.0000 | NASAL | Status: DC | PRN

## 2014-04-13 NOTE — ED Notes (Signed)
Awake. Verbally responsive. A/O x4. Resp even and unlabored. No audible adventitious breath sounds noted. ABC's intact. NAD

## 2014-04-13 NOTE — ED Provider Notes (Signed)
CSN: [id number]     Arrival date & time 04/13/14  0136 History   First MD Initiated Contact with Patient 04/13/14 0146     Chief Complaint  Patient presents with  . Otalgia    (Consider location/radiation/quality/duration/timing/severity/associated sxs/prior Treatment) HPI Comments: Patient is a 23 year old male with no significant past medical history who presents to the emergency department for further evaluation of bilateral, aching otalgia. Patient states that his symptoms were preceded by nasal congestion and rhinorrhea which had been persisting over the past 1-2 weeks. Otalgia is constant without alleviating factors. Patient states he has been taking DayQuil for symptoms without relief. No ear discharge, fever, sore throat, or hearing loss. Patient states that his daughter and grow friend are sick with similar symptoms.  Patient is a 23 y.o. male presenting with ear pain. The history is provided by the patient. No language interpreter was used.  Otalgia Associated symptoms: congestion and rhinorrhea   Associated symptoms: no fever, no sore throat and no vomiting     Past Medical History  Diagnosis Date  . Obesity   . Obesity (BMI 30-39.9)    Past Surgical History  Procedure Laterality Date  . Wisdom tooth extraction     Family History  Problem Relation Age of Onset  . Allergies Father   . Hypertension Sister   . Diabetes Maternal Grandmother   . Cancer Maternal Grandmother   . Hypertension Maternal Grandmother   . Diabetes Maternal Grandfather   . Diabetes Cousin   . Hyperlipidemia Other    History  Substance Use Topics  . Smoking status: Passive Smoke Exposure - Never Smoker  . Smokeless tobacco: Never Used  . Alcohol Use: No    Review of Systems  Constitutional: Negative for fever.  HENT: Positive for congestion, ear pain, rhinorrhea and sinus pressure. Negative for drooling, sore throat and trouble swallowing.   Respiratory: Negative for shortness of breath.    Gastrointestinal: Negative for vomiting.  All other systems reviewed and are negative.   Allergies  Review of patient's allergies indicates no known allergies.  Home Medications   Prior to Admission medications   Medication Sig Start Date End Date Taking? Authorizing Provider  DM-Doxylamine-Acetaminophen (VICKS NYQUIL COLD & FLU) 15-6.25-325 MG CAPS Take 2 capsules by mouth at bedtime as needed (cough).   Yes Historical Provider, MD  DM-Phenylephrine-Acetaminophen (VICKS DAYQUIL COLD & FLU) 10-5-325 MG CAPS Take 2 capsules by mouth every 6 (six) hours as needed (cough).   Yes Historical Provider, MD  amoxicillin-clavulanate (AUGMENTIN) 875-125 MG per tablet Take 1 tablet by mouth every 12 (twelve) hours. Take for 10 days 04/13/14   Antony MaduraKelly Grady Lucci, PA-C  ibuprofen (ADVIL,MOTRIN) 800 MG tablet Take 1 tablet (800 mg total) by mouth 3 (three) times daily. Patient not taking: Reported on 04/13/2014 07/16/13   Arie Sabinaatherine E Schinlever, PA-C  sodium chloride (OCEAN) 0.65 % SOLN nasal spray Place 1 spray into both nostrils as needed for congestion. 04/13/14   Antony MaduraKelly Elpidio Thielen, PA-C   BP 137/91 mmHg  Pulse 70  Temp(Src) 97.7 F (36.5 C) (Oral)  Resp 18  SpO2 100%   Physical Exam  Constitutional: He is oriented to person, place, and time. He appears well-developed and well-nourished. No distress.  Nontoxic/nonseptic appearing  HENT:  Head: Normocephalic and atraumatic.  Right Ear: External ear and ear canal normal. No mastoid tenderness. Tympanic membrane is erythematous. Tympanic membrane is not perforated, not retracted and not bulging. A middle ear effusion is present.  Left Ear: External ear  and ear canal normal. No mastoid tenderness. Tympanic membrane is erythematous. Tympanic membrane is not perforated, not retracted and not bulging. A middle ear effusion is present.  Nose: No septal deviation or nasal septal hematoma. Right sinus exhibits no maxillary sinus tenderness and no frontal sinus  tenderness. Left sinus exhibits no maxillary sinus tenderness and no frontal sinus tenderness.  Mouth/Throat: Uvula is midline and oropharynx is clear and moist. No oropharyngeal exudate.  Audible nasal congestion. Bilateral nares patent. Mildly erythematous tympanic membranes; however, cone of light intact bilaterally. No bulging, retraction, or perforation. Middle ear effusion present. No evidence of mastoiditis. Oropharynx clear and uvula midline.  Eyes: Conjunctivae and EOM are normal. Pupils are equal, round, and reactive to light. No scleral icterus.  Neck: Normal range of motion.  No nuchal rigidity or meningismus  Pulmonary/Chest: Effort normal. No respiratory distress.  Respirations even and unlabored  Musculoskeletal: Normal range of motion.  Neurological: He is alert and oriented to person, place, and time.  Skin: Skin is warm and dry. No rash noted. He is not diaphoretic. No erythema. No pallor.  Psychiatric: He has a normal mood and affect. His behavior is normal.  Nursing note and vitals reviewed.   ED Course  Procedures (including critical care time) Labs Review Labs Reviewed - No data to display  Imaging Review No results found.   EKG Interpretation None      MDM   Final diagnoses:  Acute sinusitis, recurrence not specified, unspecified location  Otalgia of both ears    Patient complaining of symptoms of b/l otalgia which appears secondary to sinusitis symptoms. Severe symptoms have been present for greater than 10 days with purulent nasal discharge and b/l otalgia with evidence of middle ear effusion. Concern for acute bacterial rhinosinusitis. Patient discharged with Augmentin. Instructions given for warm saline nasal wash and recommendations for follow-up with primary care physician. Return precautions discussed and provided. Patient agreeable to plan with no unaddressed concerns.   Filed Vitals:   04/13/14 0142 04/13/14 0335  BP: 152/88 137/91  Pulse: 73  70  Temp: 97.7 F (36.5 C)   TempSrc: Oral   Resp: 18 18  SpO2: 100% 100%       Antony MaduraKelly Leyli Kevorkian, PA-C 04/13/14 [id number]  Derwood KaplanAnkit Nanavati, MD 04/13/14 [phone]

## 2014-04-13 NOTE — ED Notes (Signed)
Pt reports cold sxs x 1-2 weeks, started to have bila ear pain today.

## 2014-04-13 NOTE — Discharge Instructions (Signed)
Sinusitis °Sinusitis is redness, soreness, and inflammation of the paranasal sinuses. Paranasal sinuses are air pockets within the bones of your face (beneath the eyes, the middle of the forehead, or above the eyes). In healthy paranasal sinuses, mucus is able to drain out, and air is able to circulate through them by way of your nose. However, when your paranasal sinuses are inflamed, mucus and air can become trapped. This can allow bacteria and other germs to grow and cause infection. °Sinusitis can develop quickly and last only a short time (acute) or continue over a long period (chronic). Sinusitis that lasts for more than 12 weeks is considered chronic.  °CAUSES  °Causes of sinusitis include: °· Allergies. °· Structural abnormalities, such as displacement of the cartilage that separates your nostrils (deviated septum), which can decrease the air flow through your nose and sinuses and affect sinus drainage. °· Functional abnormalities, such as when the small hairs (cilia) that line your sinuses and help remove mucus do not work properly or are not present. °SIGNS AND SYMPTOMS  °Symptoms of acute and chronic sinusitis are the same. The primary symptoms are pain and pressure around the affected sinuses. Other symptoms include: °· Upper toothache. °· Earache. °· Headache. °· Bad breath. °· Decreased sense of smell and taste. °· A cough, which worsens when you are lying flat. °· Fatigue. °· Fever. °· Thick drainage from your nose, which often is green and may contain pus (purulent). °· Swelling and warmth over the affected sinuses. °DIAGNOSIS  °Your health care provider will perform a physical exam. During the exam, your health care provider may: °· Look in your nose for signs of abnormal growths in your nostrils (nasal polyps). °· Tap over the affected sinus to check for signs of infection. °· View the inside of your sinuses (endoscopy) using an imaging device that has a light attached (endoscope). °If your health  care provider suspects that you have chronic sinusitis, one or more of the following tests may be recommended: °· Allergy tests. °· Nasal culture. A sample of mucus is taken from your nose, sent to a lab, and screened for bacteria. °· Nasal cytology. A sample of mucus is taken from your nose and examined by your health care provider to determine if your sinusitis is related to an allergy. °TREATMENT  °Most cases of acute sinusitis are related to a viral infection and will resolve on their own within 10 days. Sometimes medicines are prescribed to help relieve symptoms (pain medicine, decongestants, nasal steroid sprays, or saline sprays).  °However, for sinusitis related to a bacterial infection, your health care provider will prescribe antibiotic medicines. These are medicines that will help kill the bacteria causing the infection.  °Rarely, sinusitis is caused by a fungal infection. In theses cases, your health care provider will prescribe antifungal medicine. °For some cases of chronic sinusitis, surgery is needed. Generally, these are cases in which sinusitis recurs more than 3 times per year, despite other treatments. °HOME CARE INSTRUCTIONS  °· Drink plenty of water. Water helps thin the mucus so your sinuses can drain more easily. °· Use a humidifier. °· Inhale steam 3 to 4 times a day (for example, sit in the bathroom with the shower running). °· Apply a warm, moist washcloth to your face 3 to 4 times a day, or as directed by your health care provider. °· Use saline nasal sprays to help moisten and clean your sinuses. °· Take medicines only as directed by your health care provider. °·   If you were prescribed either an antibiotic or antifungal medicine, finish it all even if you start to feel better. SEEK IMMEDIATE MEDICAL CARE IF:  You have increasing pain or severe headaches.  You have nausea, vomiting, or drowsiness.  You have swelling around your face.  You have vision problems.  You have a stiff  neck.  You have difficulty breathing. MAKE SURE YOU:   Understand these instructions.  Will watch your condition.  Will get help right away if you are not doing well or get worse. Document Released: 04/11/2005 Document Revised: 08/26/2013 Document Reviewed: 04/26/2011 [facility]ExitCare Patient Information 2015 [place], [place]. This information is not intended to replace advice given to you by your health care provider. Make sure you discuss any questions you have with your health care provider. Otalgia The most common reason for this in children is an infection of the middle ear. Pain from the middle ear is usually caused by a build-up of fluid and pressure behind the eardrum. Pain from an earache can be sharp, dull, or burning. The pain may be temporary or constant. The middle ear is connected to the nasal passages by a short narrow tube called the Eustachian tube. The Eustachian tube allows fluid to drain out of the middle ear, and helps keep the pressure in your ear equalized. CAUSES  A cold or allergy can block the Eustachian tube with inflammation and the build-up of secretions. This is especially likely in small children, because their Eustachian tube is shorter and more horizontal. When the Eustachian tube closes, the normal flow of fluid from the middle ear is stopped. Fluid can accumulate and cause stuffiness, pain, hearing loss, and an ear infection if germs start growing in this area. SYMPTOMS  The symptoms of an ear infection may include fever, ear pain, fussiness, increased crying, and irritability. Many children will have temporary and minor hearing loss during and right after an ear infection. Permanent hearing loss is rare, but the risk increases the more infections a child has. Other causes of ear pain include retained water in the outer ear canal from swimming and bathing. Ear pain in adults is less likely to be from an ear infection. Ear pain may be referred from other locations. Referred  pain may be from the joint between your jaw and the skull. It may also come from a tooth problem or problems in the neck. Other causes of ear pain include:  A foreign body in the ear.  Outer ear infection.  Sinus infections.  Impacted ear wax.  Ear injury.  Arthritis of the jaw or TMJ problems.  Middle ear infection.  Tooth infections.  Sore throat with pain to the ears. DIAGNOSIS  Your caregiver can usually make the diagnosis by examining you. Sometimes other special studies, including x-rays and lab work may be necessary. TREATMENT   If antibiotics were prescribed, use them as directed and finish them even if you or your child's symptoms seem to be improved.  Sometimes PE tubes are needed in children. These are little plastic tubes which are put into the eardrum during a simple surgical procedure. They allow fluid to drain easier and allow the pressure in the middle ear to equalize. This helps relieve the ear pain caused by pressure changes. HOME CARE INSTRUCTIONS   Only take over-the-counter or prescription medicines for pain, discomfort, or fever as directed by your caregiver. DO NOT GIVE CHILDREN ASPIRIN because of the association of Reye's Syndrome in children taking aspirin.  Use a cold  pack applied to the outer ear for 15-20 minutes, 03-04 times per day or as needed may reduce pain. Do not apply ice directly to the skin. You may cause frost bite.  Over-the-counter ear drops used as directed may be effective. Your caregiver may sometimes prescribe ear drops.  Resting in an upright position may help reduce pressure in the middle ear and relieve pain.  Ear pain caused by rapidly descending from high altitudes can be relieved by swallowing or chewing gum. Allowing infants to suck on a bottle during airplane travel can help.  Do not smoke in the house or near children. If you are unable to quit smoking, smoke outside.  Control allergies. SEEK IMMEDIATE MEDICAL CARE IF:    You or your child are becoming sicker.  Pain or fever relief is not obtained with medicine.  You or your child's symptoms (pain, fever, or irritability) do not improve within 24 to 48 hours or as instructed.  Severe pain suddenly stops hurting. This may indicate a ruptured eardrum.  You or your children develop new problems such as severe headaches, stiff neck, difficulty swallowing, or swelling of the face or around the ear. Document Released: 11/27/2003 Document Revised: 07/04/2011 Document Reviewed: 04/02/2008 [facility] Patient Information 2015 [place], [place]. This information is not intended to replace advice given to you by your health care provider. Make sure you discuss any questions you have with your health care provider.

## 2014-04-30 ENCOUNTER — Emergency Department (HOSPITAL_COMMUNITY)
Admission: EM | Admit: 2014-04-30 | Discharge: 2014-05-01 | Disposition: A | Discharge status: Home / Self Care | Payer: Medicaid Other | Attending: Emergency Medicine | Admitting: Emergency Medicine

## 2014-04-30 ENCOUNTER — Encounter (HOSPITAL_COMMUNITY): Payer: Self-pay | Admitting: Emergency Medicine

## 2014-04-30 DIAGNOSIS — Z791 Long term (current) use of non-steroidal anti-inflammatories (NSAID): Secondary | ICD-10-CM | POA: Insufficient documentation

## 2014-04-30 DIAGNOSIS — Z113 Encounter for screening for infections with a predominantly sexual mode of transmission: Secondary | ICD-10-CM

## 2014-04-30 DIAGNOSIS — E669 Obesity, unspecified: Secondary | ICD-10-CM | POA: Insufficient documentation

## 2014-04-30 DIAGNOSIS — Z202 Contact with and (suspected) exposure to infections with a predominantly sexual mode of transmission: Secondary | ICD-10-CM | POA: Insufficient documentation

## 2014-04-30 NOTE — ED Notes (Signed)
Pt states that he would like to be tested for STDs. Denies any known exposure or s/s. States that he just likes to be tested once per year.

## 2014-05-01 LAB — GC/CHLAMYDIA PROBE AMP
CT Probe RNA: NEGATIVE
GC Probe RNA: NEGATIVE

## 2014-05-01 LAB — RPR

## 2014-05-01 NOTE — ED Provider Notes (Signed)
CSN: [id number]     Arrival date & time 04/30/14  2319 History   First MD Initiated Contact with Patient 04/30/14 2338     Chief Complaint  Patient presents with  . SEXUALLY TRANSMITTED DISEASE     (Consider location/radiation/quality/duration/timing/severity/associated sxs/prior Treatment) The history is provided by the patient and medical records. No language interpreter was used.     Randy Dawson is a 24 y.o. male  with no major medical history presents to the Emergency Department requesting STD check. Patient reports that he likes to be tested for STDs once per year. He also reports that his girlfriend was diagnosed with herpes yesterday. He denies penile pain, penile discharge, dysuria, abdominal pain, nausea, vomiting, diarrhea weakness, dizziness, testicular pain, scrotal swelling. Patient reports his last STD was in college and he was treated for this. No associated symptoms or elevating or alleviating factors.  Past Medical History  Diagnosis Date  . Obesity   . Obesity (BMI 30-39.9)    Past Surgical History  Procedure Laterality Date  . Wisdom tooth extraction     Family History  Problem Relation Age of Onset  . Allergies Father   . Hypertension Sister   . Diabetes Maternal Grandmother   . Cancer Maternal Grandmother   . Hypertension Maternal Grandmother   . Diabetes Maternal Grandfather   . Diabetes Cousin   . Hyperlipidemia Other    History  Substance Use Topics  . Smoking status: Never Smoker   . Smokeless tobacco: Never Used  . Alcohol Use: Yes     Comment: socially    Review of Systems  Constitutional: Negative for fever, diaphoresis, appetite change, fatigue and unexpected weight change.  HENT: Negative for mouth sores.   Eyes: Negative for visual disturbance.  Respiratory: Negative for cough, chest tightness, shortness of breath and wheezing.   Cardiovascular: Negative for chest pain.  Gastrointestinal: Negative for nausea, vomiting, abdominal pain,  diarrhea and constipation.  Endocrine: Negative for polydipsia, polyphagia and polyuria.  Genitourinary: Negative for dysuria, urgency, frequency and hematuria.  Musculoskeletal: Negative for back pain and neck stiffness.  Skin: Negative for rash.  Allergic/Immunologic: Negative for immunocompromised state.  Neurological: Negative for syncope, light-headedness and headaches.  Hematological: Does not bruise/bleed easily.  Psychiatric/Behavioral: Negative for sleep disturbance. The patient is not nervous/anxious.       Allergies  Review of patient's allergies indicates no known allergies.  Home Medications   Prior to Admission medications   Medication Sig Start Date End Date Taking? Authorizing Provider  DM-Doxylamine-Acetaminophen (VICKS NYQUIL COLD & FLU) 15-6.25-325 MG CAPS Take 2 capsules by mouth at bedtime as needed (cough).   Yes Historical Provider, MD  pseudoephedrine-acetaminophen (TYLENOL SINUS) 30-500 MG TABS Take 1 tablet by mouth every 4 (four) hours as needed (sinus pain/pressure).   Yes Historical Provider, MD  amoxicillin-clavulanate (AUGMENTIN) 875-125 MG per tablet Take 1 tablet by mouth every 12 (twelve) hours. Take for 10 days Patient not taking: Reported on 05/01/2014 04/13/14   Antony Madura, PA-C  ibuprofen (ADVIL,MOTRIN) 800 MG tablet Take 1 tablet (800 mg total) by mouth 3 (three) times daily. Patient not taking: Reported on 04/13/2014 07/16/13   Arie Sabina Schinlever, PA-C  sodium chloride (OCEAN) 0.65 % SOLN nasal spray Place 1 spray into both nostrils as needed for congestion. Patient not taking: Reported on 05/01/2014 04/13/14   Antony Madura, PA-C   BP 135/77 mmHg  Pulse 80  Temp(Src) 98.4 F (36.9 C) (Oral)  Resp 18  Ht  (1.778  m)  Wt 314 lb (142.429 kg)  BMI 45.05 kg/m2  SpO2 99% Physical Exam  Constitutional: He appears well-developed and well-nourished. No distress.  Awake, alert, nontoxic appearance  HENT:  Head: Normocephalic and atraumatic.   Mouth/Throat: Oropharynx is clear and moist. No oropharyngeal exudate.  Eyes: Conjunctivae are normal. No scleral icterus.  Neck: Normal range of motion. Neck supple.  Cardiovascular: Normal rate, regular rhythm and intact distal pulses.   Pulmonary/Chest: Effort normal and breath sounds normal. No respiratory distress. He has no wheezes.  Equal chest expansion  Abdominal: Soft. Bowel sounds are normal. He exhibits no mass. There is no tenderness. There is no rebound and no guarding. Hernia confirmed negative in the right inguinal area and confirmed negative in the left inguinal area.  Genitourinary: Testes normal and penis normal. Cremasteric reflex is present. Right testis shows no mass, no swelling and no tenderness. Right testis is descended. Cremasteric reflex is not absent on the right side. Left testis shows no mass, no swelling and no tenderness. Left testis is descended. Cremasteric reflex is not absent on the left side. Circumcised. No phimosis, paraphimosis, hypospadias, penile erythema or penile tenderness. No discharge found.  Musculoskeletal: Normal range of motion. He exhibits no edema.  Lymphadenopathy:       Right: No inguinal adenopathy present.       Left: No inguinal adenopathy present.  Neurological: He is alert.  Speech is clear and goal oriented Moves extremities without ataxia  Skin: Skin is warm and dry. He is not diaphoretic.  Psychiatric: He has a normal mood and affect.  Nursing note and vitals reviewed.   ED Course  Procedures (including critical care time) Labs Review Labs Reviewed  GC/CHLAMYDIA PROBE AMP  RPR  HIV ANTIBODY (ROUTINE TESTING)    Imaging Review No results found.   EKG Interpretation None      MDM   Final diagnoses:  Exposure to STD  Screen for STD (sexually transmitted disease)   Randy Dawson presents with concerns about exposure to herpes. He requests [place] testing. Patient without symptoms. Normal genital exam. See testing  sent. Patient to follow-up with primary care or health department.  I have personally reviewed patient's vitals, nursing note and any pertinent labs or imaging.  I performed an focused physical exam; undressed when appropriate .    It has been determined that no acute conditions requiring further emergency intervention are present at this time. The patient/guardian have been advised of the diagnosis and plan. I reviewed any labs and imaging including any potential incidental findings. We have discussed signs and symptoms that warrant return to the ED and they are listed in the discharge instructions.    Vital signs are stable at discharge.   BP 135/77 mmHg  Pulse 80  Temp(Src) 98.4 F (36.9 C) (Oral)  Resp 18  Ht 5\' 10"  (1.778 m)  Wt 314 lb (142.429 kg)  BMI 45.05 kg/m2  SpO2 99%        Dierdre ForthHannah Nayely Dingus, PA-C 05/01/14 0020  Hanley SeamenJohn L Molpus, MD 05/01/14 [phone]

## 2014-05-01 NOTE — Discharge Instructions (Signed)
1. Medications: usual home medications 2. Treatment: rest, drink plenty of fluids,  3. Follow Up: Please followup with your primary doctor in 7 days for discussion of your diagnoses and further evaluation after today's visit; if you do not have a primary care doctor use the resource guide provided to find one; Please return to the ER for development of symptoms   Genital Herpes Genital herpes is a sexually transmitted disease. This means that it is a disease passed by having sex with an infected person. There is no cure for genital herpes. The time between attacks can be months to years. The virus may live in a person but produce no problems (symptoms). This infection can be passed to a baby as it travels down the birth canal (vagina). In a newborn, this can cause central nervous system damage, eye damage, or even death. The virus that causes genital herpes is usually HSV-2 virus. The virus that causes oral herpes is usually HSV-1. The diagnosis (learning what is wrong) is made through culture results. SYMPTOMS  Usually symptoms of pain and itching begin a few days to a week after contact. It first appears as small blisters that progress to small painful ulcers which then scab over and heal after several days. It affects the outer genitalia, birth canal, cervix, penis, anal area, buttocks, and thighs. HOME CARE INSTRUCTIONS   Keep ulcerated areas dry and clean.  Take medications as directed. Antiviral medications can speed up healing. They will not prevent recurrences or cure this infection. These medications can also be taken for suppression if there are frequent recurrences.  While the infection is active, it is contagious. Avoid all sexual contact during active infections.  Condoms may help prevent spread of the herpes virus.  Practice safe sex.  Wash your hands thoroughly after touching the genital area.  Avoid touching your eyes after touching your genital area.  Inform your caregiver  if you have had genital herpes and become pregnant. It is your responsibility to insure a safe outcome for your baby in this pregnancy.  Only take over-the-counter or prescription medicines for pain, discomfort, or fever as directed by your caregiver. SEEK MEDICAL CARE IF:   You have a recurrence of this infection.  You do not respond to medications and are not improving.  You have new sources of pain or discharge which have changed from the original infection.  You have an oral temperature above 102 F (38.9 C).  You develop abdominal pain.  You develop eye pain or signs of eye infection. Document Released: 04/08/2000 Document Revised: 07/04/2011 Document Reviewed: 04/29/2009 [facility] Patient Information 2015 [employer] [place]. This information is not intended to replace advice given to you by your health care provider. Make sure you discuss any questions you have with your health care provider.

## 2014-05-02 LAB — HIV ANTIBODY (ROUTINE TESTING W REFLEX)
HIV 1/O/2 Abs-Index Value: <1 (ref <1.00)
HIV-1/HIV-2 Ab: NONREACTIVE

## 2015-03-28 ENCOUNTER — Encounter (HOSPITAL_COMMUNITY): Payer: Self-pay | Admitting: Nurse Practitioner

## 2015-03-28 ENCOUNTER — Emergency Department (HOSPITAL_COMMUNITY)
Admission: EM | Admit: 2015-03-28 | Discharge: 2015-03-29 | Disposition: A | Discharge status: Home / Self Care | Payer: Medicaid Other | Attending: Emergency Medicine | Admitting: Emergency Medicine

## 2015-03-28 DIAGNOSIS — Z113 Encounter for screening for infections with a predominantly sexual mode of transmission: Secondary | ICD-10-CM | POA: Insufficient documentation

## 2015-03-28 DIAGNOSIS — E669 Obesity, unspecified: Secondary | ICD-10-CM | POA: Insufficient documentation

## 2015-03-28 NOTE — ED Notes (Signed)
Pt states he brought his cousin to get his rash checked out, states he wants to have an STD check while he waits for his cousin. Denies any symptoms whatsoever.

## 2015-03-29 NOTE — Discharge Instructions (Signed)
Safe Sex  Safe sex is about reducing the risk of giving or getting a sexually transmitted disease (STD). STDs are spread through sexual contact involving the genitals, mouth, or rectum. Some STDs can be cured and others cannot. Safe sex can also prevent unintended pregnancies.   WHAT ARE SOME SAFE SEX PRACTICES?  · Limit your sexual activity to only one partner who is having sex with only you.  · Talk to your partner about his or her past partners, past STDs, and drug use.  · Use a condom every time you have sexual intercourse. This includes vaginal, oral, and anal sexual activity. Both females and males should wear condoms during oral sex. Only use latex or polyurethane condoms and water-based lubricants. Using petroleum-based lubricants or oils to lubricate a condom will weaken the condom and increase the chance that it will break. The condom should be in place from the beginning to the end of sexual activity. Wearing a condom reduces, but does not completely eliminate, your risk of getting or giving an STD. STDs can be spread by contact with infected body fluids and skin.  · Get vaccinated for hepatitis B and HPV.  · Avoid alcohol and recreational drugs, which can affect your judgment. You may forget to use a condom or participate in high-risk sex.  · For females, avoid douching after sexual intercourse. Douching can spread an infection farther into the reproductive tract.  · Check your body for signs of sores, blisters, rashes, or unusual discharge. See your health care provider if you notice any of these signs.  · Avoid sexual contact if you have symptoms of an infection or are being treated for an STD. If you or your partner has herpes, avoid sexual contact when blisters are present. Use condoms at all other times.  · If you are at risk of being infected with HIV, it is recommended that you take a prescription medicine daily to prevent HIV infection. This is called pre-exposure prophylaxis (PrEP). You are  considered at risk if:    You are a man who has sex with other men (MSM).    You are a heterosexual man or woman who is sexually active with more than one partner.    You take drugs by injection.    You are sexually active with a partner who has HIV.  · Talk with your health care provider about whether you are at high risk of being infected with HIV. If you choose to begin PrEP, you should first be tested for HIV. You should then be tested every 3 months for as long as you are taking PrEP.  · See your health care provider for regular screenings, exams, and tests for other STDs. Before having sex with a new partner, each of you should be screened for STDs and should talk about the results with each other.  WHAT ARE THE BENEFITS OF SAFE SEX?   · There is less chance of getting or giving an STD.  · You can prevent unwanted or unintended pregnancies.  · By discussing safe sex concerns with your partner, you may increase feelings of intimacy, comfort, trust, and honesty between the two of you.     This information is not intended to replace advice given to you by your health care provider. Make sure you discuss any questions you have with your health care provider.     Document Released: 05/19/2004 Document Revised: 05/02/2014 Document Reviewed: 10/03/2011  Elsevier Interactive Patient Education ©2016 [employer].

## 2015-03-29 NOTE — ED Provider Notes (Signed)
CSN: [id number]     Arrival date & time 03/28/15  2241 History   First MD Initiated Contact with Patient 03/29/15 0011     Chief Complaint  Patient presents with  . Wants STD Check       (Consider location/radiation/quality/duration/timing/severity/associated sxs/prior Treatment) HPI Comments: Pt request std test.  Pt here with cousin.   Pt reports he wants to get checked for STD's.  Pt reports no symptoms. No discharge. No lesions.  No fever.   Pt reports partner has not had any symptoms.  Pt does not want blood work.  Pt request gc and ct only.   The history is provided by the patient. No language interpreter was used.    Past Medical History  Diagnosis Date  . Obesity   . Obesity (BMI 30-39.9)    Past Surgical History  Procedure Laterality Date  . Wisdom tooth extraction     Family History  Problem Relation Age of Onset  . Allergies Father   . Hypertension Sister   . Diabetes Maternal Grandmother   . Cancer Maternal Grandmother   . Hypertension Maternal Grandmother   . Diabetes Maternal Grandfather   . Diabetes Cousin   . Hyperlipidemia Other    Social History  Substance Use Topics  . Smoking status: Never Smoker   . Smokeless tobacco: Never Used  . Alcohol Use: Yes     Comment: socially    Review of Systems  All other systems reviewed and are negative.     Allergies  Review of patient's allergies indicates no known allergies.  Home Medications   Prior to Admission medications   Medication Sig Start Date End Date Taking? Authorizing Provider  amoxicillin-clavulanate (AUGMENTIN) 875-125 MG per tablet Take 1 tablet by mouth every 12 (twelve) hours. Take for 10 days Patient not taking: Reported on 05/01/2014 04/13/14   Antony Madura, PA-C  DM-Doxylamine-Acetaminophen (VICKS NYQUIL COLD & FLU) 15-6.25-325 MG CAPS Take 2 capsules by mouth at bedtime as needed (cough).    Historical Provider, MD  ibuprofen (ADVIL,MOTRIN) 800 MG tablet Take 1 tablet (800 mg total)  by mouth 3 (three) times daily. Patient not taking: Reported on 04/13/2014 07/16/13   Ruby Cola, PA-C  pseudoephedrine-acetaminophen (TYLENOL SINUS) 30-500 MG TABS Take 1 tablet by mouth every 4 (four) hours as needed (sinus pain/pressure).    Historical Provider, MD  sodium chloride (OCEAN) 0.65 % SOLN nasal spray Place 1 spray into both nostrils as needed for congestion. Patient not taking: Reported on 05/01/2014 04/13/14   Antony Madura, PA-C   BP 153/85 mmHg  Pulse 70  Temp(Src) 98.4 F (36.9 C) (Oral)  Resp 17  SpO2 100% Physical Exam  Constitutional: He is oriented to person, place, and time. He appears well-developed and well-nourished.  HENT:  Head: Normocephalic.  Eyes: EOM are normal.  Neck: Normal range of motion.  Cardiovascular: Normal rate.   Pulmonary/Chest: Effort normal.  Abdominal: He exhibits no distension.  Genitourinary: Penis normal. No penile tenderness.  No lesions, no discharge  Musculoskeletal: Normal range of motion.  Neurological: He is alert and oriented to person, place, and time.  Psychiatric: He has a normal mood and affect.  Nursing note and vitals reviewed.   ED Course  Procedures (including critical care time) Labs Review Labs Reviewed  GC/CHLAMYDIA PROBE AMP ([place]) NOT AT [facility]    Imaging Review No results found. I have personally reviewed and evaluated these images and lab results as part of my medical decision-making.  EKG Interpretation None      MDM   Final diagnoses:  Screen for STD (sexually transmitted disease)    gc and ct pending    Elson AreasLeslie K Jie Stickels, PA-C 03/29/15 0028  Gilda Creasehristopher J Pollina, MD 03/29/15 0030

## 2015-03-30 LAB — GC/CHLAMYDIA PROBE AMP (~~LOC~~) NOT AT ARMC
Chlamydia: NEGATIVE
Neisseria Gonorrhea: NEGATIVE

## 2015-07-03 ENCOUNTER — Emergency Department (HOSPITAL_COMMUNITY)
Admission: EM | Admit: 2015-07-03 | Discharge: 2015-07-04 | Disposition: A | Discharge status: Home / Self Care | Payer: Medicaid Other | Attending: Emergency Medicine | Admitting: Emergency Medicine

## 2015-07-03 DIAGNOSIS — Z202 Contact with and (suspected) exposure to infections with a predominantly sexual mode of transmission: Secondary | ICD-10-CM | POA: Insufficient documentation

## 2015-07-03 DIAGNOSIS — Z79899 Other long term (current) drug therapy: Secondary | ICD-10-CM | POA: Insufficient documentation

## 2015-07-03 DIAGNOSIS — E669 Obesity, unspecified: Secondary | ICD-10-CM | POA: Insufficient documentation

## 2015-07-04 ENCOUNTER — Encounter (HOSPITAL_COMMUNITY): Payer: Self-pay | Admitting: Oncology

## 2015-07-04 MED ORDER — CEFTRIAXONE SODIUM 250 MG IJ SOLR
250.0000 mg | Freq: Once | INTRAMUSCULAR | Status: AC
  Administered 2015-07-04: 250 mg via INTRAMUSCULAR
  Filled 2015-07-04: qty 250

## 2015-07-04 MED ORDER — AZITHROMYCIN 250 MG PO TABS
1000.0000 mg | ORAL_TABLET | Freq: Once | ORAL | Status: AC
  Administered 2015-07-04: 1000 mg via ORAL
  Filled 2015-07-04: qty 4

## 2015-07-04 MED ORDER — SODIUM CHLORIDE 0.9 % IJ SOLN
INTRAMUSCULAR | Status: AC
  Administered 2015-07-04: 10 mL
  Filled 2015-07-04: qty 10

## 2015-07-04 NOTE — ED Provider Notes (Signed)
CSN: [id number]     Arrival date & time 07/03/15  2344 History   First MD Initiated Contact with Patient 07/04/15 [phone]     Chief Complaint  Patient presents with  . Exposure to STD     (Consider location/radiation/quality/duration/timing/severity/associated sxs/prior Treatment) Patient is a 25 y.o. male presenting with STD exposure. The history is provided by the patient. No language interpreter was used.  Exposure to STD This is a new problem. The current episode started in the past 7 days. The problem occurs constantly. The problem has been unchanged. Pertinent negatives include no joint swelling or vomiting. He has tried nothing for the symptoms. The treatment provided no relief.  Pt reports he was exposed to chlamydia.    Past Medical History  Diagnosis Date  . Obesity   . Obesity (BMI 30-39.9)    Past Surgical History  Procedure Laterality Date  . Wisdom tooth extraction     Family History  Problem Relation Age of Onset  . Allergies Father   . Hypertension Sister   . Diabetes Maternal Grandmother   . Cancer Maternal Grandmother   . Hypertension Maternal Grandmother   . Diabetes Maternal Grandfather   . Diabetes Cousin   . Hyperlipidemia Other    Social History  Substance Use Topics  . Smoking status: Never Smoker   . Smokeless tobacco: Never Used  . Alcohol Use: Yes     Comment: socially    Review of Systems  Gastrointestinal: Negative for vomiting.  Musculoskeletal: Negative for joint swelling.  All other systems reviewed and are negative.     Allergies  Review of patient's allergies indicates no known allergies.  Home Medications   Prior to Admission medications   Medication Sig Start Date End Date Taking? Authorizing Provider  amoxicillin-clavulanate (AUGMENTIN) 875-125 MG per tablet Take 1 tablet by mouth every 12 (twelve) hours. Take for 10 days Patient not taking: Reported on 05/01/2014 04/13/14   Antony Madura, PA-C  DM-Doxylamine-Acetaminophen  (VICKS NYQUIL COLD & FLU) 15-6.25-325 MG CAPS Take 2 capsules by mouth at bedtime as needed (cough).    Historical Provider, MD  ibuprofen (ADVIL,MOTRIN) 800 MG tablet Take 1 tablet (800 mg total) by mouth 3 (three) times daily. Patient not taking: Reported on 04/13/2014 07/16/13   Ruby Cola, PA-C  pseudoephedrine-acetaminophen (TYLENOL SINUS) 30-500 MG TABS Take 1 tablet by mouth every 4 (four) hours as needed (sinus pain/pressure).    Historical Provider, MD  sodium chloride (OCEAN) 0.65 % SOLN nasal spray Place 1 spray into both nostrils as needed for congestion. Patient not taking: Reported on 05/01/2014 04/13/14   Antony Madura, PA-C   BP 136/76 mmHg  Pulse 71  Temp(Src) 98.3 F (36.8 C) (Oral)  Resp 20  Ht  (1.778 m)  Wt 139.708 kg  BMI 44.19 kg/m2  SpO2 99% Physical Exam  Constitutional: He is oriented to person, place, and time. He appears well-developed and well-nourished.  HENT:  Head: Normocephalic.  Eyes: Conjunctivae and EOM are normal. Pupils are equal, round, and reactive to light.  Neck: Normal range of motion.  Cardiovascular: Normal rate and normal heart sounds.   Pulmonary/Chest: Effort normal and breath sounds normal.  Abdominal: Soft. He exhibits no distension.  Musculoskeletal: Normal range of motion.  Neurological: He is alert and oriented to person, place, and time.  Skin: Skin is warm.  Psychiatric: He has a normal mood and affect.  Nursing note and vitals reviewed.   ED Course  Procedures (including critical care time)  Labs Review Labs Reviewed  GC/CHLAMYDIA PROBE AMP ([place]) NOT AT [facility]    Imaging Review No results found. I have personally reviewed and evaluated these images and lab results as part of my medical decision-making.   EKG Interpretation None      MDM   Final diagnoses:  STD exposure    Meds ordered this encounter  Medications  . cefTRIAXone (ROCEPHIN) injection 250 mg    Sig:     Order Specific  Question:  Antibiotic Indication:    Answer:  STD  . azithromycin (ZITHROMAX) tablet 1,000 mg    Sig:   . sodium chloride 0.9 % injection    Sig:     Excell Seltzerooper, Richard   : cabinet override      Elson AreasLeslie K Sofia, PA-C 07/04/15 0459  Cy BlamerApril Palumbo, MD 07/04/15 [phone]

## 2015-07-04 NOTE — ED Notes (Signed)
Pt reports that he had unprotected sex w/ a partner that recently tested positive for chlamydia.  Denies any current sx.  States he would like to be checked.

## 2015-07-04 NOTE — Discharge Instructions (Signed)
Safe Sex  Safe sex is about reducing the risk of giving or getting a sexually transmitted disease (STD). STDs are spread through sexual contact involving the genitals, mouth, or rectum. Some STDs can be cured and others cannot. Safe sex can also prevent unintended pregnancies.   WHAT ARE SOME SAFE SEX PRACTICES?  · Limit your sexual activity to only one partner who is having sex with only you.  · Talk to your partner about his or her past partners, past STDs, and drug use.  · Use a condom every time you have sexual intercourse. This includes vaginal, oral, and anal sexual activity. Both females and males should wear condoms during oral sex. Only use latex or polyurethane condoms and water-based lubricants. Using petroleum-based lubricants or oils to lubricate a condom will weaken the condom and increase the chance that it will break. The condom should be in place from the beginning to the end of sexual activity. Wearing a condom reduces, but does not completely eliminate, your risk of getting or giving an STD. STDs can be spread by contact with infected body fluids and skin.  · Get vaccinated for hepatitis B and HPV.  · Avoid alcohol and recreational drugs, which can affect your judgment. You may forget to use a condom or participate in high-risk sex.  · For females, avoid douching after sexual intercourse. Douching can spread an infection farther into the reproductive tract.  · Check your body for signs of sores, blisters, rashes, or unusual discharge. See your health care provider if you notice any of these signs.  · Avoid sexual contact if you have symptoms of an infection or are being treated for an STD. If you or your partner has herpes, avoid sexual contact when blisters are present. Use condoms at all other times.  · If you are at risk of being infected with HIV, it is recommended that you take a prescription medicine daily to prevent HIV infection. This is called pre-exposure prophylaxis (PrEP). You are  considered at risk if:    You are a man who has sex with other men (MSM).    You are a heterosexual man or woman who is sexually active with more than one partner.    You take drugs by injection.    You are sexually active with a partner who has HIV.  · Talk with your health care provider about whether you are at high risk of being infected with HIV. If you choose to begin PrEP, you should first be tested for HIV. You should then be tested every 3 months for as long as you are taking PrEP.  · See your health care provider for regular screenings, exams, and tests for other STDs. Before having sex with a new partner, each of you should be screened for STDs and should talk about the results with each other.  WHAT ARE THE BENEFITS OF SAFE SEX?   · There is less chance of getting or giving an STD.  · You can prevent unwanted or unintended pregnancies.  · By discussing safe sex concerns with your partner, you may increase feelings of intimacy, comfort, trust, and honesty between the two of you.     This information is not intended to replace advice given to you by your health care provider. Make sure you discuss any questions you have with your health care provider.     Document Released: 05/19/2004 Document Revised: 05/02/2014 Document Reviewed: 10/03/2011  Elsevier Interactive Patient Education ©2016 [employer].

## 2015-07-06 LAB — GC/CHLAMYDIA PROBE AMP (~~LOC~~) NOT AT ARMC
Chlamydia: NEGATIVE
Neisseria Gonorrhea: NEGATIVE

## 2015-08-14 ENCOUNTER — Emergency Department (HOSPITAL_COMMUNITY)
Admission: EM | Admit: 2015-08-14 | Discharge: 2015-08-15 | Disposition: A | Discharge status: Home / Self Care | Payer: Medicaid Other | Attending: Emergency Medicine | Admitting: Emergency Medicine

## 2015-08-14 ENCOUNTER — Encounter (HOSPITAL_COMMUNITY): Payer: Self-pay | Admitting: Oncology

## 2015-08-14 DIAGNOSIS — E669 Obesity, unspecified: Secondary | ICD-10-CM | POA: Insufficient documentation

## 2015-08-14 DIAGNOSIS — Z6839 Body mass index (BMI) 39.0-39.9, adult: Secondary | ICD-10-CM | POA: Insufficient documentation

## 2015-08-14 DIAGNOSIS — Z202 Contact with and (suspected) exposure to infections with a predominantly sexual mode of transmission: Secondary | ICD-10-CM | POA: Insufficient documentation

## 2015-08-14 MED ORDER — AZITHROMYCIN 250 MG PO TABS
1000.0000 mg | ORAL_TABLET | Freq: Once | ORAL | Status: AC
  Administered 2015-08-14: 1000 mg via ORAL
  Filled 2015-08-14: qty 4

## 2015-08-14 MED ORDER — STERILE WATER FOR INJECTION IJ SOLN
INTRAMUSCULAR | Status: AC
  Administered 2015-08-14: 10 mL
  Filled 2015-08-14: qty 10

## 2015-08-14 MED ORDER — METRONIDAZOLE 500 MG PO TABS
2000.0000 mg | ORAL_TABLET | Freq: Once | ORAL | Status: AC
  Administered 2015-08-14: 2000 mg via ORAL
  Filled 2015-08-14: qty 4

## 2015-08-14 MED ORDER — CEFTRIAXONE SODIUM 250 MG IJ SOLR
250.0000 mg | Freq: Once | INTRAMUSCULAR | Status: AC
  Administered 2015-08-14: 250 mg via INTRAMUSCULAR
  Filled 2015-08-14: qty 250

## 2015-08-14 NOTE — Discharge Instructions (Signed)
Return here as needed.  Follow-up with the health department °

## 2015-08-14 NOTE — ED Provider Notes (Signed)
CSN: [id number]     Arrival date & time 08/14/15  2215 History   First MD Initiated Contact with Patient 08/14/15 2235     Chief Complaint  Patient presents with  . Exposure to STD     (Consider location/radiation/quality/duration/timing/severity/associated sxs/prior Treatment) HPI Patient presents to the emergency department with concerns about exposure to STD.  The patient states that he had sexual intercourse with a male states that she tested positive for gonorrhea.  Patient states he would like treatment for STDs.  Patient states that he is had a previous history of STDs.  Patient denies penile discharge, dysuria, hematuria, or fever Past Medical History  Diagnosis Date  . Obesity   . Obesity (BMI 30-39.9)    Past Surgical History  Procedure Laterality Date  . Wisdom tooth extraction     Family History  Problem Relation Age of Onset  . Allergies Father   . Hypertension Sister   . Diabetes Maternal Grandmother   . Cancer Maternal Grandmother   . Hypertension Maternal Grandmother   . Diabetes Maternal Grandfather   . Diabetes Cousin   . Hyperlipidemia Other    Social History  Substance Use Topics  . Smoking status: Never Smoker   . Smokeless tobacco: Never Used  . Alcohol Use: Yes     Comment: socially    Review of Systems  All other systems negative except as documented in the HPI. All pertinent positives and negatives as reviewed in the HPI.  Allergies  Review of patient's allergies indicates no known allergies.  Home Medications   Prior to Admission medications   Medication Sig Start Date End Date Taking? Authorizing Provider  DM-Phenylephrine-Acetaminophen (VICKS DAYQUIL COLD & FLU) 10-5-325 MG CAPS Take 2 capsules by mouth 2 (two) times daily as needed (for cold).   Yes Historical Provider, MD   BP 147/97 mmHg  Pulse 78  Temp(Src) 98.2 F (36.8 C) (Oral)  Resp 16  Ht 5\' 9"  (1.753 m)  Wt 144.697 kg  BMI 47.09 kg/m2  SpO2 98% Physical Exam   Constitutional: He is oriented to person, place, and time. He appears well-developed and well-nourished. No distress.  HENT:  Head: Normocephalic and atraumatic.  Eyes: Pupils are equal, round, and reactive to light.  Neck: Normal range of motion. Neck supple.  Cardiovascular: Normal rate, regular rhythm and normal heart sounds.  Exam reveals no gallop and no friction rub.   No murmur heard. Pulmonary/Chest: Effort normal and breath sounds normal. No respiratory distress. He has no wheezes.  Abdominal: He exhibits no distension. There is no tenderness.  Genitourinary: Penis normal.  Neurological: He is alert and oriented to person, place, and time. He exhibits normal muscle tone. Coordination normal.  Skin: Skin is warm and dry. No rash noted. No erythema.  Psychiatric: He has a normal mood and affect. His behavior is normal.  Nursing note and vitals reviewed.   ED Course  Procedures (including critical care time) Labs Review Labs Reviewed  GC/CHLAMYDIA PROBE AMP ([place]) NOT AT [facility]    Imaging Review No results found. I have personally reviewed and evaluated these images and lab results as part of my medical decision-making.  Patient be treated for gonorrhea and chlamydia.  Told to return here as needed.  Patient agrees the plan and all questions were answered    Charlestine NightChristopher Alek Poncedeleon, PA-C 08/14/15 2353  Lyndal Pulleyaniel Knott, MD 08/16/15 [phone]

## 2015-08-14 NOTE — ED Notes (Signed)
Pt would like to be tested for STD as had sex w/ someone w/ known gonorrhea and the condom broke.  Pt reports having mild pain to his penis.  Denies discharge, burning w/ urination or sores to his penis.

## 2016-02-10 ENCOUNTER — Encounter (HOSPITAL_COMMUNITY): Payer: Self-pay | Admitting: Family Medicine

## 2016-02-10 ENCOUNTER — Emergency Department (HOSPITAL_COMMUNITY)
Admission: EM | Admit: 2016-02-10 | Discharge: 2016-02-10 | Disposition: A | Discharge status: Home / Self Care | Payer: Medicaid Other | Attending: Emergency Medicine | Admitting: Emergency Medicine

## 2016-02-10 DIAGNOSIS — Z202 Contact with and (suspected) exposure to infections with a predominantly sexual mode of transmission: Secondary | ICD-10-CM | POA: Insufficient documentation

## 2016-02-10 DIAGNOSIS — Z711 Person with feared health complaint in whom no diagnosis is made: Secondary | ICD-10-CM

## 2016-02-10 DIAGNOSIS — M629 Disorder of muscle, unspecified: Secondary | ICD-10-CM | POA: Insufficient documentation

## 2016-02-10 LAB — URINALYSIS, ROUTINE W REFLEX MICROSCOPIC
Bilirubin Urine: NEGATIVE
Glucose, UA: NEGATIVE mg/dL
Ketones, ur: NEGATIVE mg/dL
LEUKOCYTES UA: NEGATIVE
NITRITE: NEGATIVE
PH: 6.5 (ref 5.0–8.0)
Protein, ur: NEGATIVE mg/dL
SPECIFIC GRAVITY, URINE: 1.027 (ref 1.005–1.030)

## 2016-02-10 LAB — RAPID HIV SCREEN (HIV 1/2 AB+AG)
HIV 1/2 Antibodies: NONREACTIVE
HIV-1 P24 Antigen - HIV24: NONREACTIVE

## 2016-02-10 LAB — URINE MICROSCOPIC-ADD ON

## 2016-02-10 MED ORDER — STERILE WATER FOR INJECTION IJ SOLN
INTRAMUSCULAR | Status: AC
  Filled 2016-02-10: qty 10

## 2016-02-10 MED ORDER — AZITHROMYCIN 250 MG PO TABS
1000.0000 mg | ORAL_TABLET | Freq: Once | ORAL | Status: AC
  Administered 2016-02-10: 1000 mg via ORAL
  Filled 2016-02-10: qty 4

## 2016-02-10 MED ORDER — CEFTRIAXONE SODIUM 250 MG IJ SOLR
250.0000 mg | Freq: Once | INTRAMUSCULAR | Status: AC
  Administered 2016-02-10: 250 mg via INTRAMUSCULAR
  Filled 2016-02-10: qty 250

## 2016-02-10 NOTE — ED Triage Notes (Signed)
Patient would like to be tested for STD's due to thinking he feels like he has been exposed. Pt reports he is having white penile discharge. Discharge started Sunday night.

## 2016-02-10 NOTE — ED Provider Notes (Signed)
[facility]-EMERGENCY DEPT Provider Note   CSN: [id number] Arrival date & time: 02/10/16  1907     History   Chief Complaint Chief Complaint  Patient presents with  . SEXUALLY TRANSMITTED DISEASE    HPI Randy Dawson is a 25 y.o. male.  HPI   25 year old obese male present requesting to be tested for STD.  Pt report having white penile discharge x 3 days.  Recent having sexual intercourse with same partner for the past 1 month, not using protection.  Has been tested positive for chlamydia in the past.  Report urine is a bit darker than usual.  Denies fever, abd pain, back pain, penile pain, testicular or scrotal pain.    Past Medical History:  Diagnosis Date  . Obesity   . Obesity (BMI 30-39.9)     Patient Active Problem List   Diagnosis Date Noted  . Hyperlipidemia 05/14/2013  . Severe obesity (BMI >= 40) (HCC) 01/28/2013    Past Surgical History:  Procedure Laterality Date  . WISDOM TOOTH EXTRACTION         Home Medications    Prior to Admission medications   Medication Sig Start Date End Date Taking? Authorizing Provider  DM-Phenylephrine-Acetaminophen (VICKS DAYQUIL COLD & FLU) 10-5-325 MG CAPS Take 2 capsules by mouth 2 (two) times daily as needed (for cold).    Historical Provider, MD    Family History Family History  Problem Relation Age of Onset  . Allergies Father   . Hypertension Sister   . Diabetes Maternal Grandmother   . Cancer Maternal Grandmother   . Hypertension Maternal Grandmother   . Diabetes Maternal Grandfather   . Diabetes Cousin   . Hyperlipidemia Other     Social History Social History  Substance Use Topics  . Smoking status: Never Smoker  . Smokeless tobacco: Never Used  . Alcohol use Yes     Comment: Once every 3 months     Allergies   Review of patient's allergies indicates no known allergies.   Review of Systems Review of Systems  Constitutional: Negative for fever.  Gastrointestinal: Negative for abdominal pain.    Genitourinary: Positive for discharge. Negative for dysuria, flank pain, hematuria, penile pain, penile swelling, scrotal swelling and testicular pain.  Skin: Negative for rash.     Physical Exam Updated Vital Signs BP (!) 157/115 (BP Location: Left Arm)   Pulse 68   Temp 98.8 F (37.1 C) (Oral)   Resp 16   Ht 5\' 10"  (1.778 m)   Wt (!) 140.9 kg   SpO2 98%   BMI 44.58 kg/m   Physical Exam  Constitutional: He appears well-developed and well-nourished. No distress.  HENT:  Head: Atraumatic.  Eyes: Conjunctivae are normal.  Neck: Neck supple.  Abdominal: Bowel sounds are normal. There is no tenderness.  Genitourinary: Prostate normal and penis normal. No penile tenderness.  Genitourinary Comments: Chaperone present  Neurological: He is alert. He exhibits abnormal muscle tone.  Skin: No rash noted.  Psychiatric: He has a normal mood and affect.  Nursing note and vitals reviewed.    ED Treatments / Results  Labs (all labs ordered are listed, but only abnormal results are displayed) Labs Reviewed  URINALYSIS, ROUTINE W REFLEX MICROSCOPIC (NOT AT [facility]) - Abnormal; Notable for the following:       Result Value   Hgb urine dipstick SMALL (*)    All other components within normal limits  URINE MICROSCOPIC-ADD ON - Abnormal; Notable for the following:  Squamous Epithelial / LPF 0-5 (*)    Bacteria, UA RARE (*)    All other components within normal limits  RAPID HIV SCREEN (HIV 1/2 AB+AG)  RPR  GC/CHLAMYDIA PROBE AMP ([place]) NOT AT [facility]    EKG  EKG Interpretation None       Radiology No results found.  Procedures Procedures (including critical care time)  Medications Ordered in ED Medications - No data to display   Initial Impression / Assessment and Plan / ED Course  I have reviewed the triage vital signs and the nursing notes.  Pertinent labs & imaging results that were available during my care of the patient were reviewed by me and considered in  my medical decision making (see chart for details).  Clinical Course    BP (!) 157/115 (BP Location: Left Arm)   Pulse 68   Temp 98.8 F (37.1 C) (Oral)   Resp 16   Ht 5\' 10"  (1.778 m)   Wt (!) 140.9 kg   SpO2 98%   BMI 44.58 kg/m    Final Clinical Impressions(s) / ED Diagnoses   Final diagnoses:  Concern about STD in male without diagnosis    New Prescriptions New Prescriptions   No medications on file   9:03 PM Pt present requesting to be tested and treated for potential STD after noticing penile discharge. No concerning finding noted on exam.  Will treat for STD prophylactic with rocephin/zithromax.  Encourage partner to also be treated if pt tested positive for STD.  Safe sex practice discussed.   Fayrene HelperBowie Sarim Rothman, PA-C 02/10/16 2145    Nira ConnPedro Eduardo Cardama, MD 02/11/16 [phone]

## 2016-02-10 NOTE — Discharge Instructions (Signed)
You have been tested for STD.  If your test comes back positive you will be notify in the next few days.  Avoid sexual activities until your symptoms are completely resolved.  Notify partner to get treated if you did test positive for infection.

## 2016-02-11 LAB — RPR: RPR Ser Ql: NONREACTIVE

## 2016-02-12 LAB — GC/CHLAMYDIA PROBE AMP (~~LOC~~) NOT AT ARMC
Chlamydia: NEGATIVE
Neisseria Gonorrhea: NEGATIVE

## 2016-05-08 ENCOUNTER — Emergency Department (HOSPITAL_COMMUNITY)
Admission: EM | Admit: 2016-05-08 | Discharge: 2016-05-08 | Disposition: A | Discharge status: Home / Self Care | Payer: Managed Care, Other (non HMO) | Attending: Emergency Medicine | Admitting: Emergency Medicine

## 2016-05-08 DIAGNOSIS — N342 Other urethritis: Secondary | ICD-10-CM

## 2016-05-08 DIAGNOSIS — Z79899 Other long term (current) drug therapy: Secondary | ICD-10-CM | POA: Diagnosis not present

## 2016-05-08 DIAGNOSIS — R369 Urethral discharge, unspecified: Secondary | ICD-10-CM | POA: Diagnosis present

## 2016-05-08 MED ORDER — CEFTRIAXONE SODIUM 250 MG IJ SOLR
250.0000 mg | Freq: Once | INTRAMUSCULAR | Status: AC
Start: 2016-05-08 — End: 2016-05-08
  Administered 2016-05-08: 250 mg via INTRAMUSCULAR
  Filled 2016-05-08: qty 250

## 2016-05-08 MED ORDER — ONDANSETRON 8 MG PO TBDP
8.0000 mg | ORAL_TABLET | Freq: Once | ORAL | Status: AC
  Administered 2016-05-08: 8 mg via ORAL
  Filled 2016-05-08: qty 1

## 2016-05-08 MED ORDER — LIDOCAINE HCL 2 % IJ SOLN
INTRAMUSCULAR | Status: AC
  Filled 2016-05-08: qty 20

## 2016-05-08 MED ORDER — AZITHROMYCIN 250 MG PO TABS
1000.0000 mg | ORAL_TABLET | Freq: Once | ORAL | Status: AC
  Administered 2016-05-08: 1000 mg via ORAL
  Filled 2016-05-08: qty 4

## 2016-05-08 MED ORDER — METRONIDAZOLE 500 MG PO TABS
2000.0000 mg | ORAL_TABLET | Freq: Once | ORAL | Status: AC
  Administered 2016-05-08: 2000 mg via ORAL
  Filled 2016-05-08: qty 4

## 2016-05-08 NOTE — ED Triage Notes (Signed)
Pt states that he has had white d/c from his penis x 1 week. Unprotected sex. Alert and oriented.

## 2016-05-08 NOTE — ED Notes (Signed)
Patient was alert, oriented and stable upon discharge. RN went over AVS and patient had no further questions.  

## 2016-05-08 NOTE — Discharge Instructions (Signed)
Free HIV and STD Testing °These locations offer FREE confidential testing for HIV, Chlamydia, Gonorrhea, and Syphilis. °Non-Traditional Testing Sites Address Telephone ° °[facility] [address], °[place] °[phone] °Mondays 5pm - 7pm ° °[facility] °[address], [address] °[place] °[phone] °Wednesdays 2pm-8pm ° °[facility] °[address], °[place] °[phone] °Thursdays 9am-12noon °1pm-4pm ° °[facility] °[facility] °[address], [place] °[phone] °Tuesdays °Thursdays °9am-12noon °1pm-4pm ° °[facility] offers free, confidential testing and treatment for HIV, Chlamydia, Gonorrhea, Syphilis, Herpes, Bacterial Vaginosis, Yeast, and Trichomoniasis. °Traditional Testing ° ° °[facility] °[address], °[place] °[phone]  °Monday thru Friday  °Call for an appointment ° °[facility] [place] °[facility] °[address] °[place] °[phone] °Monday thru Friday  °Call for anappointment. ° °If you have any questions about this information please call [phone]. °03/03/2011 ° °

## 2016-05-08 NOTE — ED Provider Notes (Signed)
[facility]-EMERGENCY DEPT Provider Note   CSN: [id number] Arrival date & time: 05/08/16  1918     History   Chief Complaint Chief Complaint  Patient presents with  . Penile Discharge    HPI Randy Dawson is a 26 y.o. male who presents emergency Department with chief complaint of penile discharge. Patient states that he had unprotected sexual intercourse at the end of November and had onset of penile discharge on January 3. He denies testicular pain. He denies burning sensation with urination. He describes a creamy white discharge from the meatus. He has no other complaints or symptoms at this time.  HPI  Past Medical History:  Diagnosis Date  . Obesity   . Obesity (BMI 30-39.9)     Patient Active Problem List   Diagnosis Date Noted  . Hyperlipidemia 05/14/2013  . Severe obesity (BMI >= 40) (HCC) 01/28/2013    Past Surgical History:  Procedure Laterality Date  . WISDOM TOOTH EXTRACTION         Home Medications    Prior to Admission medications   Medication Sig Start Date End Date Taking? Authorizing Provider  DM-Phenylephrine-Acetaminophen (VICKS DAYQUIL COLD & FLU) 10-5-325 MG CAPS Take 2 capsules by mouth 2 (two) times daily as needed (for cold).    Historical Provider, MD    Family History Family History  Problem Relation Age of Onset  . Allergies Father   . Hypertension Sister   . Diabetes Maternal Grandmother   . Cancer Maternal Grandmother   . Hypertension Maternal Grandmother   . Diabetes Maternal Grandfather   . Diabetes Cousin   . Hyperlipidemia Other     Social History Social History  Substance Use Topics  . Smoking status: Never Smoker  . Smokeless tobacco: Never Used  . Alcohol use Yes     Comment: Once every 3 months     Allergies   Patient has no known allergies.   Review of Systems Review of Systems Ten systems reviewed and are negative for acute change, except as noted in the HPI.    Physical Exam Updated Vital Signs BP  101/67 (BP Location: Right Arm)   Pulse 77   Temp 98.4 F (36.9 C) (Oral)   Resp 16   SpO2 95%   Physical Exam  Constitutional: He appears well-developed and well-nourished. No distress.  HENT:  Head: Normocephalic and atraumatic.  Eyes: Conjunctivae are normal. No scleral icterus.  Neck: Normal range of motion. Neck supple.  Cardiovascular: Normal rate, regular rhythm and normal heart sounds.   Pulmonary/Chest: Effort normal and breath sounds normal. No respiratory distress.  Abdominal: Soft. There is no tenderness.  Genitourinary:  Genitourinary Comments: Circumcised male, normal anatomy, no testicular pain or swelling. Discharge from the urethral meatus. No genital lesions.  Musculoskeletal: He exhibits no edema.  Neurological: He is alert.  Skin: Skin is warm and dry. He is not diaphoretic.  Psychiatric: His behavior is normal.  Nursing note and vitals reviewed.    ED Treatments / Results  Labs (all labs ordered are listed, but only abnormal results are displayed) Labs Reviewed  RPR  HIV ANTIBODY (ROUTINE TESTING)  GC/CHLAMYDIA PROBE AMP ([place]) NOT AT [facility]    EKG  EKG Interpretation None       Radiology No results found.  Procedures Procedures (including critical care time)  Medications Ordered in ED Medications  lidocaine (XYLOCAINE) 2 % (with pres) injection (not administered)  cefTRIAXone (ROCEPHIN) injection 250 mg (250 mg Intramuscular Given 05/08/16 2113)  azithromycin [facility]) tablet 1,000 mg (1,000 mg Oral Given 05/08/16 2114)  ondansetron (ZOFRAN-ODT) disintegrating tablet 8 mg (8 mg Oral Given 05/08/16 2114)  metroNIDAZOLE (FLAGYL) tablet 2,000 mg (2,000 mg Oral Given 05/08/16 2114)     Initial Impression / Assessment and Plan / ED Course  I have reviewed the triage vital signs and the nursing notes.  Pertinent labs & imaging results that were available during my care of the patient were reviewed by me and considered in my medical  decision making (see chart for details).  Clinical Course      Patient treated in the ED for STI with [see list of medications above].. Patient advised to inform and treat all sexual partners. Patient advised not to have any unprotected intercourse for the next 10 days.  Pt advised on safe sex practices and understands that they have GC/Chlamydia cultures pending and will result in 2-3 days. HIV and RPR sent. Pt encouraged to follow up at local health department for future STI checks. No concern for prostatitis or epididymitis. Discussed return precautions. Pt appears safe for discharge.   Final Clinical Impressions(s) / ED Diagnoses   Final diagnoses:  Urethritis    New Prescriptions New Prescriptions   No medications on file     Arthor Captain, PA-C 05/08/16 2124    Mancel Bale, MD 05/09/16 [phone]

## 2016-05-09 LAB — GC/CHLAMYDIA PROBE AMP (~~LOC~~) NOT AT ARMC
Chlamydia: NEGATIVE
NEISSERIA GONORRHEA: NEGATIVE

## 2016-05-10 LAB — RPR: RPR Ser Ql: NONREACTIVE

## 2016-05-10 LAB — HIV ANTIBODY (ROUTINE TESTING W REFLEX): HIV SCREEN 4TH GENERATION: NONREACTIVE

## 2017-06-16 ENCOUNTER — Encounter (HOSPITAL_COMMUNITY): Payer: Self-pay | Admitting: Emergency Medicine

## 2017-06-16 ENCOUNTER — Other Ambulatory Visit: Payer: Self-pay

## 2017-06-16 DIAGNOSIS — R079 Chest pain, unspecified: Secondary | ICD-10-CM | POA: Insufficient documentation

## 2017-06-16 DIAGNOSIS — M542 Cervicalgia: Secondary | ICD-10-CM | POA: Insufficient documentation
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
## 2017-06-17 ENCOUNTER — Encounter (HOSPITAL_COMMUNITY): Payer: Self-pay | Admitting: Radiology

## 2017-06-17 ENCOUNTER — Emergency Department (HOSPITAL_COMMUNITY)
Admission: EM | Admit: 2017-06-17 | Discharge: 2017-06-17 | Disposition: A | Discharge status: Home / Self Care | Payer: Managed Care, Other (non HMO) | Attending: Emergency Medicine | Admitting: Emergency Medicine

## 2017-06-17 ENCOUNTER — Emergency Department (HOSPITAL_COMMUNITY): Payer: Managed Care, Other (non HMO)

## 2017-06-17 MED ORDER — IOPAMIDOL (ISOVUE-300) INJECTION 61%
INTRAVENOUS | Status: AC
  Filled 2017-06-17: qty 100

## 2017-06-17 MED ORDER — BACLOFEN 10 MG PO TABS: 10.0000 mg | ORAL_TABLET | Freq: Three times a day (TID) | ORAL | 0 refills | Status: AC

## 2017-06-17 MED ORDER — MELOXICAM 15 MG PO TABS: 15.0000 mg | ORAL_TABLET | Freq: Every day | ORAL | 0 refills | Status: AC

## 2017-06-17 MED ORDER — SODIUM CHLORIDE 0.9 % IJ SOLN
INTRAMUSCULAR | Status: DC
Start: 2017-06-17 — End: 2017-06-17
  Filled 2017-06-17: qty 50

## 2017-06-17 MED ORDER — IOPAMIDOL (ISOVUE-300) INJECTION 61%
100.0000 mL | Freq: Once | INTRAVENOUS | Status: AC | PRN
  Administered 2017-06-17: 100 mL via INTRAVENOUS
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
## 2017-06-17 NOTE — ED Notes (Signed)
Bed: WTR8 Expected date:  Expected time:  Means of arrival:  Comments: 

## 2017-06-17 NOTE — Discharge Instructions (Signed)
Your images were negative for any abnormality. Return to the emergency department immediately if you develop any of the following symptoms: You have numbness, tingling, or weakness in the arms or legs. You develop severe headaches not relieved with medicine. You have severe neck pain, especially tenderness in the middle of the back of your neck. You have changes in bowel or bladder control. There is increasing pain in any area of the body. You have shortness of breath, light-headedness, dizziness, or fainting. You have chest pain. You feel sick to your stomach (nauseous), throw up (vomit), or sweat. You have increasing abdominal discomfort. There is blood in your urine, stool, or vomit. You have pain in your shoulder (shoulder strap areas). You feel your symptoms are getting worse.

## 2018-05-09 ENCOUNTER — Encounter (HOSPITAL_COMMUNITY): Payer: Self-pay | Admitting: Emergency Medicine

## 2018-05-09 ENCOUNTER — Other Ambulatory Visit: Payer: Self-pay

## 2018-05-09 ENCOUNTER — Emergency Department (HOSPITAL_COMMUNITY)
Admission: EM | Admit: 2018-05-09 | Discharge: 2018-05-09 | Disposition: A | Discharge status: Home / Self Care | Payer: Managed Care, Other (non HMO) | Attending: Emergency Medicine | Admitting: Emergency Medicine

## 2018-05-09 DIAGNOSIS — H6013 Cellulitis of external ear, bilateral: Secondary | ICD-10-CM | POA: Insufficient documentation

## 2018-05-09 DIAGNOSIS — Z79899 Other long term (current) drug therapy: Secondary | ICD-10-CM | POA: Insufficient documentation

## 2018-05-09 MED ORDER — CEPHALEXIN 500 MG PO CAPS: 500.0000 mg | ORAL_CAPSULE | Freq: Four times a day (QID) | ORAL | 0 refills | Status: AC

## 2018-05-09 NOTE — ED Triage Notes (Addendum)
Patient c/o pain to bilateral earlobes at earrings x3 days.  Denies cough, congestion, body aches, fevers.

## 2018-05-09 NOTE — Discharge Instructions (Signed)
You should take the guages out and make sure the area is clean and dry.  Take antibiotics as directed. Please take all of your antibiotics until finished.  Follow-up with referred ENT doctor for further evaluation.  Return emergency department for any worsening swelling, redness of the ear, fevers or any other worsening or concerning symptoms.

## 2018-05-09 NOTE — ED Provider Notes (Signed)
[facility]-EMERGENCY DEPT Provider Note   CSN: [id number] Arrival date & time: 05/09/18  1338     History   Chief Complaint Chief Complaint  Patient presents with  . Otalgia    HPI Randy Dawson is a 28 y.o. male who presents for evaluation of pain in bilateral earlobes that is been ongoing for the last 4 days.  Patient reports that approxi-5 days ago, he went to a piercing parlor to have the gauges in his earlobes widened.  He states that this caused his ear lobes to be "blown out" she states is where the inside of the skin wraps around on the outside.  He reports that since then, he has had pain, redness and warmth to bilateral earlobes.  He is also noted some purulent drainage from bilateral earlobes.  He has been cleaning the ears with saline.  He denies any fevers.  Patient denies any hearing loss.  The history is provided by the patient.    Past Medical History:  Diagnosis Date  . Obesity   . Obesity (BMI 30-39.9)     Patient Active Problem List   Diagnosis Date Noted  . Hyperlipidemia 05/14/2013  . Severe obesity (BMI >= 40) (HCC) 01/28/2013    Past Surgical History:  Procedure Laterality Date  . WISDOM TOOTH EXTRACTION          Home Medications    Prior to Admission medications   Medication Sig Start Date End Date Taking? Authorizing Provider  baclofen (LIORESAL) 10 MG tablet Take 1 tablet (10 mg total) by mouth 3 (three) times daily. 06/17/17   Arthor Captain, PA-C  cephALEXin (KEFLEX) 500 MG capsule Take 1 capsule (500 mg total) by mouth 4 (four) times daily for 7 days. 05/09/18 05/16/18  Maxwell Caul, PA-C  DM-Phenylephrine-Acetaminophen (VICKS DAYQUIL COLD & FLU) 10-5-325 MG CAPS Take 2 capsules by mouth 2 (two) times daily as needed (for cold).    [provider]  meloxicam (MOBIC) 15 MG tablet Take 1 tablet (15 mg total) by mouth daily. Take 1 daily with food. 06/17/17   Arthor Captain, PA-C    Family History Family  History  Problem Relation Age of Onset  . Allergies Father   . Hypertension Sister   . Diabetes Maternal Grandmother   . Cancer Maternal Grandmother   . Hypertension Maternal Grandmother   . Diabetes Maternal Grandfather   . Diabetes Cousin   . Hyperlipidemia Other     Social History Social History   Tobacco Use  . Smoking status: Never Smoker  . Smokeless tobacco: Never Used  Substance Use Topics  . Alcohol use: Yes    Comment: Once every 3 months  . Drug use: No     Allergies   Patient has no known allergies.   Review of Systems Review of Systems  Constitutional: Negative for fever.  HENT: Positive for ear discharge and ear pain.   All other systems reviewed and are negative.    Physical Exam Updated Vital Signs BP (!) 159/106 (BP Location: Left Arm)   Pulse 80   Temp 98.4 F (36.9 C) (Oral)   Resp 18   Ht 5\' 11"  (1.803 m)   Wt (!) 145.2 kg   SpO2 98%   BMI 44.63 kg/m   Physical Exam Vitals signs and nursing note reviewed.  Constitutional:      Appearance: He is well-developed.  HENT:     Head: Normocephalic and atraumatic.     Right Ear:  Tympanic membrane normal. No mastoid tenderness.     Left Ear: Tympanic membrane normal. No mastoid tenderness.     Ears:      Comments: No tenderness, warmth, erythema noted to mastoid process bilaterally. Eyes:     General: No scleral icterus.       Right eye: No discharge.        Left eye: No discharge.     Conjunctiva/sclera: Conjunctivae normal.  Pulmonary:     Effort: Pulmonary effort is normal.  Skin:    General: Skin is warm and dry.  Neurological:     Mental Status: He is alert.  Psychiatric:        Speech: Speech normal.        Behavior: Behavior normal.      ED Treatments / Results  Labs (all labs ordered are listed, but only abnormal results are displayed) Labs Reviewed - No data to display  EKG None  Radiology No results found.  Procedures Procedures (including critical care  time)  Medications Ordered in ED Medications - No data to display   Initial Impression / Assessment and Plan / ED Course  I have reviewed the triage vital signs and the nursing notes.  Pertinent labs & imaging results that were available during my care of the patient were reviewed by me and considered in my medical decision making (see chart for details).     28 year old male who presents for evaluation of pain to bilateral earlobes.  He reports that he had his gauges stretched out to make them bigger 5 days ago.  He reports that afterwards, he started having pain, redness, warmth, drainage from the area.  No fevers. Patient is afebrile, non-toxic appearing, sitting comfortably on examination table. Vital signs reviewed and stable.  Patient is hypertensive.  His earlobes appear warm and erythematous and slightly edematous.  The gauges are in place but surrounding the gauges, he has areas of erythema, purulent drainage.  I discussed with him regarding taking the gauges out and making sure the wounds are clean.  We will plan to start him on antibiotics for cellulitis.  History/physical exam is not concerning for acute otitis media, mastoiditis.  Patient with no known drug allergies.  Given outpatient ENT referral. At this time, patient exhibits no emergent life-threatening condition that require further evaluation in ED or admission. Patient had ample opportunity for questions and discussion. All patient's questions were answered with full understanding. Strict return precautions discussed. Patient expresses understanding and agreement to plan.   Portions of this note were generated with Scientist, clinical (histocompatibility and immunogenetics). Dictation errors may occur despite best attempts at proofreading.   Final Clinical Impressions(s) / ED Diagnoses   Final diagnoses:  Cellulitis of earlobe, bilateral    ED Discharge Orders         Ordered    cephALEXin (KEFLEX) 500 MG capsule  4 times daily     05/09/18 1511             Rosana Hoes 05/09/18 1603    Linwood Dibbles, MD 05/09/18 1605
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
